# Patient Record
Sex: Female | Born: 2000 | Race: Black or African American | Hispanic: No | Marital: Single | Smoking: Former smoker
Health system: Southern US, Community
[De-identification: ages and names within clinical notes are randomized; demographics above are authoritative.]

## PROBLEM LIST (undated history)

## (undated) ENCOUNTER — Inpatient Hospital Stay (HOSPITAL_COMMUNITY): Payer: Self-pay

## (undated) ENCOUNTER — Emergency Department (HOSPITAL_COMMUNITY): Admission: EM | Payer: Medicaid Other | Source: Home / Self Care

## (undated) DIAGNOSIS — Z3480 Encounter for supervision of other normal pregnancy, unspecified trimester: Secondary | ICD-10-CM

## (undated) HISTORY — PX: NO PAST SURGERIES: SHX2092

## (undated) HISTORY — DX: Encounter for supervision of other normal pregnancy, unspecified trimester: Z34.80

---
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
## 2018-01-01 ENCOUNTER — Ambulatory Visit (HOSPITAL_COMMUNITY)
Admission: RE | Admit: 2018-01-01 | Discharge: 2018-01-01 | Disposition: A | Discharge status: Home / Self Care | Payer: Medicaid Other | Source: Ambulatory Visit | Attending: Certified Nurse Midwife | Admitting: Certified Nurse Midwife

## 2018-01-01 DIAGNOSIS — Z3A00 Weeks of gestation of pregnancy not specified: Secondary | ICD-10-CM | POA: Diagnosis not present

## 2018-01-01 DIAGNOSIS — Z3401 Encounter for supervision of normal first pregnancy, first trimester: Secondary | ICD-10-CM

## 2018-01-01 DIAGNOSIS — O3680X0 Pregnancy with inconclusive fetal viability, not applicable or unspecified: Secondary | ICD-10-CM | POA: Diagnosis not present

## 2018-01-02 ENCOUNTER — Telehealth: Payer: Self-pay

## 2018-01-02 NOTE — Telephone Encounter (Signed)
-----   Message from Sharyon Cable, CNM sent at 01/01/2018  1:47 PM EDT ----- Patient needs appointment in person ASAP on 10/25 to review [place] results from 10/24.   Needs to see a provider in order to review failed pregnancy, obtain CBC and management.   Thank you!

## 2018-01-02 NOTE — Telephone Encounter (Signed)
Pt aware she needs a visit today to discuss recent ultrasound results but she refused d/t her work schedule. Pt is available Monday, 01/05/18 to discuss results.   Pt adamantly requests results via telephone. Informed pt ultrasound shows nonviable fetus and she needs to keep appt on Monday to discuss further with the provider. Pt agrees and has no further questions.

## 2018-01-05 ENCOUNTER — Encounter: Payer: Medicaid Other | Admitting: Advanced Practice Midwife

## 2018-01-05 DIAGNOSIS — O021 Missed abortion: Secondary | ICD-10-CM | POA: Insufficient documentation

## 2018-01-07 ENCOUNTER — Encounter: Payer: Medicaid Other | Admitting: Certified Nurse Midwife

## 2018-01-08 ENCOUNTER — Ambulatory Visit (INDEPENDENT_AMBULATORY_CARE_PROVIDER_SITE_OTHER): Payer: Medicaid Other | Admitting: Certified Nurse Midwife

## 2018-01-08 ENCOUNTER — Encounter: Payer: Self-pay | Admitting: Certified Nurse Midwife

## 2018-01-08 VITALS — BP 109/71 | HR 71 | Wt 126.0 lb

## 2018-01-08 DIAGNOSIS — O034 Incomplete spontaneous abortion without complication: Secondary | ICD-10-CM

## 2018-01-08 NOTE — Patient Instructions (Signed)

## 2018-01-08 NOTE — Progress Notes (Signed)
Pt here to F/U U/S results on 01/01/18. B/P:109/71 P:71

## 2018-01-08 NOTE — Progress Notes (Signed)
Patient ID: Erika Bailey, female   DOB: [birth year], 17 y.o.   MRN: [id number]  HPI 17 y.o. G1P0 @[redacted]w[redacted]d  by LMP presents to office for f/u and [place] results.  She denies abdominal pain or vaginal bleeding. Was seen for initial prenatal visit on 10/10 at [redacted]w[redacted]d and BS [place] saw no embryo.   Her quant hcg on 12/18/17 was 180.399 and ultrasound on 12/18/17 showed intrauterine saclike structure with no yolk sac or embryo. Repeat US on 01/01/18 showed irregular shape of gestational sac and yolk sac, continuation of no embryo. Diagnosed with failed pregnancy on 01/01/18.  OBJECTIVE  BP 109/71   Pulse 71   Wt 126 lb (57.2 kg)   LMP 10/02/2017   VS reviewed, nursing note reviewed,  Constitutional: well developed, well nourished, no distress HEENT: normocephalic CV: normal rate Pulm/chest wall: normal effort Abdomen: soft Neuro: alert and oriented x 3 Skin: warm, dry Psych: affect normal  ASSESSMENT/PLAN 1. Incomplete miscarriage - [place] results reviewed, discussed need of medical management with Cytotec due to failed expectant management. Answered patient questions.  - Reviewed procedure with patient and explained in detail process with follow up in 1 week for repeat lab work and to make sure she is bleeding. Explained possibility of D&E procedure if she does not pass POC. Patient verbalizes understanding  - No recent HGB obtained, will obtain CBC today and prescribe Cytotec tomorrow in addition to pain medication once results of CBC are back.  - CBC - Beta hCG quant (ref lab)  Follow up in 1 week for repeat HCG and 2 weeks for appointment with provider.   Steward Drone, CNM 01/08/18 10:33 AM

## 2018-01-09 LAB — CBC
Hematocrit: 36.8 % (ref 34.0–46.6)
Hemoglobin: 11.7 g/dL (ref 11.1–15.9)
MCH: 28.7 pg (ref 26.6–33.0)
MCHC: 31.8 g/dL (ref 31.5–35.7)
MCV: 90 fL (ref 79–97)
Platelets: 310 10*3/uL (ref 150–450)
RBC: 4.08 x10E6/uL (ref 3.77–5.28)
RDW: 14 % (ref 12.3–15.4)
WBC: 6.8 10*3/uL (ref 3.4–10.8)

## 2018-01-09 LAB — BETA HCG QUANT (REF LAB): hCG Quant: 84948 m[IU]/mL

## 2018-01-09 MED ORDER — MISOPROSTOL 200 MCG PO TABS: ORAL_TABLET | ORAL | 0 refills | Status: DC

## 2018-01-09 NOTE — Addendum Note (Signed)
Addended by: Sharyon Cable on: 01/09/2018 04:31 PM   Modules accepted: Orders

## 2018-01-10 ENCOUNTER — Encounter (HOSPITAL_COMMUNITY): Payer: Self-pay

## 2018-01-10 ENCOUNTER — Inpatient Hospital Stay (HOSPITAL_COMMUNITY)
Admission: AD | Admit: 2018-01-10 | Discharge: 2018-01-10 | Disposition: A | Discharge status: Home / Self Care | Payer: Medicaid Other | Source: Ambulatory Visit | Attending: Obstetrics and Gynecology | Admitting: Obstetrics and Gynecology

## 2018-01-10 DIAGNOSIS — O034 Incomplete spontaneous abortion without complication: Secondary | ICD-10-CM | POA: Diagnosis not present

## 2018-01-10 DIAGNOSIS — Z87891 Personal history of nicotine dependence: Secondary | ICD-10-CM | POA: Insufficient documentation

## 2018-01-10 DIAGNOSIS — R109 Unspecified abdominal pain: Secondary | ICD-10-CM | POA: Insufficient documentation

## 2018-01-10 LAB — URINALYSIS, ROUTINE W REFLEX MICROSCOPIC
Bilirubin Urine: NEGATIVE
Glucose, UA: NEGATIVE mg/dL
Ketones, ur: NEGATIVE mg/dL
Nitrite: NEGATIVE
Protein, ur: NEGATIVE mg/dL
RBC / HPF: >50 RBC/hpf — ABNORMAL HIGH (ref 0–5)
Specific Gravity, Urine: 1.014 (ref 1.005–1.030)
pH: 9 — ABNORMAL HIGH (ref 5.0–8.0)

## 2018-01-10 MED ORDER — ONDANSETRON 4 MG PO TBDP: 4.0000 mg | ORAL_TABLET | Freq: Three times a day (TID) | ORAL | 0 refills | Status: DC | PRN

## 2018-01-10 MED ORDER — ONDANSETRON 8 MG PO TBDP
8.0000 mg | ORAL_TABLET | Freq: Once | ORAL | Status: AC
  Administered 2018-01-10: 8 mg via ORAL
  Filled 2018-01-10: qty 1

## 2018-01-10 MED ORDER — ACETAMINOPHEN-CODEINE #3 300-30 MG PO TABS
1.0000 | ORAL_TABLET | Freq: Once | ORAL | Status: AC
  Administered 2018-01-10: 1 via ORAL
  Filled 2018-01-10: qty 1

## 2018-01-10 MED ORDER — ACETAMINOPHEN-CODEINE #3 300-30 MG PO TABS: 1.0000 | ORAL_TABLET | Freq: Four times a day (QID) | ORAL | 0 refills | Status: AC | PRN

## 2018-01-10 NOTE — MAU Note (Signed)
Erika Bailey is a 17 y.o.here in MAU reporting: +lower abdominal cramping. Constant. Seen in the office on 10/31 for an incomplete miscarriage. Pain score: 6/10 Vitals:   01/10/18 1558  BP: 107/62  Pulse: 77  Resp: 16  Temp: 98.8 F (37.1 C)  SpO2: 99%      Lab orders placed from triage: ua

## 2018-01-10 NOTE — Discharge Instructions (Signed)

## 2018-01-10 NOTE — MAU Provider Note (Signed)
History     CSN: [id number]  Arrival date and time: 01/10/18 1542   First Provider Initiated Contact with Patient 01/10/18 1612      Chief Complaint  Patient presents with  . Abdominal Pain   Erika Bailey is a 17 y.o. G1P0  who presents to MAU with complaints of abdominal pain. She was diagnosed with a incomplete miscarriage and Rx Cytotec yesterday. Took Cytotec this afternoon around 1400 prior to arrival to MAU. She reports abdominal pain started shortly after taking Cytotec. Rates pain 8/10- has taken Ibuprofen with no relief of pain, was not prescribed medication for pain when given Cytotec. She reports pain has decreased since arrival to MAU and when she started having vaginal bleeding. She denies having to change pads or soaking through underwear. She reports nausea is associated with the pain. Has f/u in the office on Thursday for miscarriage.    OB History    Gravida  1   Para      Term      Preterm      AB      Living        SAB      TAB      Ectopic      Multiple      Live Births              Past Medical History:  Diagnosis Date  . Supervision of other normal pregnancy, antepartum 12/18/2017    Nursing Staff Provider Office Location   [facility] Dating   LMP Language   English Anatomy US   Flu Vaccine   12/18/17 Genetic Screen  NIPS:   AFP:   First Screen:  Quad:   TDaP vaccine    Hgb A1C or  GTT Early  Third trimester  Rhogam     LAB RESULTS  Feeding Plan  Breast Blood Type    Contraception  Antibody   Circumcision  Rubella   Pediatrician   RPR    Support Person  HBsAg    Prenatal Classes     History reviewed. No pertinent surgical history.  Family History  Problem Relation Age of Onset  . Healthy Mother   . Healthy Father   . Cancer Maternal Grandmother     Social History   Tobacco Use  . Smoking status: Former Games developer  . Smokeless tobacco: Never Used  Substance Use Topics  . Alcohol use: No  . Drug use: Never    Allergies:   Allergies  Allergen Reactions  . Peanuts [Peanut Oil] Itching and Swelling    Medications Prior to Admission  Medication Sig Dispense Refill Last Dose  . misoprostol (CYTOTEC) 200 MCG tablet Place four tablets in between your gums and cheeks (two tablets on each side), swallow if not dissolved in 20 minutes 4 tablet 0   . prenatal vitamin w/FE, FA (PRENATAL 1 + 1) 27-1 MG TABS tablet Take 1 tablet by mouth daily at 12 noon.   Taking    Review of Systems  Constitutional: Negative.   Respiratory: Negative.   Cardiovascular: Negative.   Gastrointestinal: Positive for abdominal pain and nausea. Negative for constipation, diarrhea and vomiting.  Genitourinary: Positive for vaginal bleeding. Negative for difficulty urinating, dysuria, frequency, pelvic pain and urgency.   Physical Exam   Blood pressure 107/62, pulse 77, temperature 98.8 F (37.1 C), temperature source Oral, resp. rate 16, weight 58.1 kg, last menstrual period 10/02/2017, SpO2 99 %.  Physical Exam  Nursing note and  vitals reviewed. Constitutional: She is oriented to person, place, and time. She appears well-developed and well-nourished. No distress.  Cardiovascular: Normal rate, regular rhythm and normal heart sounds.  Respiratory: Effort normal and breath sounds normal. No respiratory distress. She has no wheezes.  GI: Soft. She exhibits no distension. There is no tenderness. There is no rebound.  Neurological: She is alert and oriented to person, place, and time.  Psychiatric: She has a normal mood and affect. Her behavior is normal. Thought content normal.    MAU Course  Procedures  MDM Orders Placed This Encounter  Procedures  . Urinalysis, Routine w reflex microscopic   Meds ordered this encounter  Medications  . acetaminophen-codeine (TYLENOL #3) 300-30 MG per tablet 1 tablet  . ondansetron (ZOFRAN-ODT) disintegrating tablet 8 mg  . ondansetron (ZOFRAN ODT) 4 MG disintegrating tablet    Sig: Take 1  tablet (4 mg total) by mouth every 8 (eight) hours as needed for nausea or vomiting.    Dispense:  10 tablet    Refill:  0    Order Specific Question:   Supervising Provider    Answer:   CONSTANT, PEGGY [[id number]]  . acetaminophen-codeine (TYLENOL #3) 300-30 MG tablet    Sig: Take 1 tablet by mouth every 6 (six) hours as needed for up to 3 days for moderate pain.    Dispense:  12 tablet    Refill:  0    Order Specific Question:   Supervising Provider    Answer:   Reva Bores [[id number]]   Treatments in MAU included Tylenol #3 tablet and Zofran 8mg  ODT. On reassessment patient reports pain is relieved. Rx for Zofran and Tylenol #3 sent to pharmacy of choice. Pt stable at time of discharge. Continue f/u as scheduled in the office.   Assessment and Plan   1. Incomplete miscarriage   2. Abdominal cramping    Discharge home  Follow up as scheduled for miscarriage  Rx for Tylenol 3 and Zofran sent to pharmacy of choice  Discussed reasons to return to MAU   Follow-up Information    [facility] Follow up.   Specialty:  Obstetrics and Gynecology Why:  Follow up as scheduled on Thursday  Contact information: [address] [place] [address] [phone]         Allergies as of 01/10/2018      Reactions   Peanuts [peanut Oil] Itching, Swelling      Medication List    TAKE these medications   acetaminophen-codeine 300-30 MG tablet Commonly known as:  TYLENOL #3 Take 1 tablet by mouth every 6 (six) hours as needed for up to 3 days for moderate pain.   misoprostol 200 MCG tablet Commonly known as:  CYTOTEC Place four tablets in between your gums and cheeks (two tablets on each side), swallow if not dissolved in 20 minutes   ondansetron 4 MG disintegrating tablet Commonly known as:  ZOFRAN-ODT Take 1 tablet (4 mg total) by mouth every 8 (eight) hours as needed for nausea or vomiting.   prenatal vitamin w/FE, FA 27-1 MG Tabs  tablet Take 1 tablet by mouth daily at 12 noon.      Sharyon Cable CNM 01/10/2018, 4:51 PM

## 2018-01-15 ENCOUNTER — Encounter: Payer: Self-pay | Admitting: Advanced Practice Midwife

## 2018-01-15 ENCOUNTER — Ambulatory Visit (INDEPENDENT_AMBULATORY_CARE_PROVIDER_SITE_OTHER): Payer: Medicaid Other | Admitting: Advanced Practice Midwife

## 2018-01-15 VITALS — BP 97/60 | HR 89 | Wt 129.2 lb

## 2018-01-15 DIAGNOSIS — O034 Incomplete spontaneous abortion without complication: Secondary | ICD-10-CM | POA: Diagnosis not present

## 2018-01-15 DIAGNOSIS — Z3A15 15 weeks gestation of pregnancy: Secondary | ICD-10-CM

## 2018-01-15 NOTE — Patient Instructions (Signed)

## 2018-01-15 NOTE — Progress Notes (Signed)
Subjective: Erika Bailey is a G1P0 at [redacted]w[redacted]d who presents to the [facility] today for MAU follow up.  She does nothave a history of any mental health concerns. She is not currently sexually active. She is currently using no method for birth control. Patient states family as her support system.   BP 97/60   Pulse 89   Wt 129 lb 3.2 oz (58.6 kg)   LMP 10/02/2017   Birth Control History:  No prior history   MDM Patient counseled on all options for birth control today including LARC. Patient desires OCP  initiated for birth control.   Assessment:  17 y.o. female considering pills for birth control  Plan:  Further family planning counseling once requested lab work come back   Gwyndolyn Saxon, [place] 01/15/2018 3:39 PM

## 2018-01-15 NOTE — Progress Notes (Signed)
FOLLOWUP CARE AFTER INCOMPLETE MISCARRIAGE ENCOUNTER NOTE  Subjective:   Erika Bailey is a 17 y.o. G1P0 female here for follow-up after diagnosis of incomplete miscarriage and administration of Cytotec on 01/10/18.   Current complaints: None.   Patient denies abdominal pain and endorses "just a little" brown vaginal spotting. Denies cramping, fever, abdominal tenderness, discharge, pelvic pain, problems with intercourse or other gynecologic concerns.    Obstetric History OB History  Gravida Para Term Preterm AB Living  1            SAB TAB Ectopic Multiple Live Births               # Outcome Date GA Lbr Len/2nd Weight Sex Delivery Anes PTL Lv  1 Current             Past Medical History:  Diagnosis Date  . Supervision of other normal pregnancy, antepartum 12/18/2017    Nursing Staff Provider Office Location   [facility] Dating   LMP Language   English Anatomy US   Flu Vaccine   12/18/17 Genetic Screen  NIPS:   AFP:   First Screen:  Quad:   TDaP vaccine    Hgb A1C or  GTT Early  Third trimester  Rhogam     LAB RESULTS  Feeding Plan  Breast Blood Type    Contraception  Antibody   Circumcision  Rubella   Pediatrician   RPR    Support Person  HBsAg    Prenatal Classes     No past surgical history on file.  Current Outpatient Medications on File Prior to Visit  Medication Sig Dispense Refill  . acetaminophen (TYLENOL) 500 MG tablet Take 500 mg by mouth every 6 (six) hours as needed.    . misoprostol (CYTOTEC) 200 MCG tablet Place four tablets in between your gums and cheeks (two tablets on each side), swallow if not dissolved in 20 minutes (Patient not taking: Reported on 01/15/2018) 4 tablet 0  . ondansetron (ZOFRAN ODT) 4 MG disintegrating tablet Take 1 tablet (4 mg total) by mouth every 8 (eight) hours as needed for nausea or vomiting. (Patient not taking: Reported on 01/15/2018) 10 tablet 0  . prenatal vitamin w/FE, FA (PRENATAL 1 + 1) 27-1 MG TABS tablet Take 1 tablet by mouth daily  at 12 noon.     No current facility-administered medications on file prior to visit.     Allergies  Allergen Reactions  . Peanuts [Peanut Oil] Itching and Swelling    Social History:  reports that she has quit smoking. She has never used smokeless tobacco. She reports that she does not drink alcohol or use drugs.  Family History  Problem Relation Age of Onset  . Healthy Mother   . Healthy Father   . Cancer Maternal Grandmother     The following portions of the patient's history were reviewed and updated as appropriate: allergies, current medications, past family history, past medical history, past social history, past surgical history and problem list.  Review of Systems Pertinent items noted in HPI and remainder of comprehensive ROS otherwise negative.   Objective:  BP 97/60   Pulse 89   Wt 129 lb 3.2 oz (58.6 kg)   LMP 10/02/2017  CONSTITUTIONAL: Well-developed, well-nourished female in no acute distress.  HENT:  Normocephalic, atraumatic, Oropharynx is clear and moist EYES: Conjunctivae and EOM are normal. Pupils are equal, round, and reactive to light. No scleral icterus.  NECK: Normal range of motion, supple,  no masses.  SKIN: Skin is warm and dry. No rash noted. Not diaphoretic. No erythema. No pallor. MUSCULOSKELETAL: Normal range of motion. No tenderness.  No cyanosis, clubbing, or edema.  NEUROLOGIC: Alert and oriented to person, place, and time. Normal reflexes, muscle tone coordination. No cranial nerve deficit noted. PSYCHIATRIC: Normal mood and affect. Normal behavior. Normal judgment and thought content. RESPIRATORY: Effort and breath sounds normal, no problems with respiration noted. ABDOMEN: Soft, no distention noted.  No tenderness, rebound or guarding.    Assessment and Plan:  1. Incomplete miscarriage - Patient met with Lynnea Ferrier, LCSW as part of my appt today - Will plan to start OCPs once result of Quant hCG is obtained - Patient confirms she is  comfortable communicating via MyChart as needed - Beta hCG quant (ref lab) - Discussed that patient may ovulate in as little as 4 weeks after miscarriage. This can be triggering and she should reach out to her support network including the [facility] team as needed  Routine preventative health maintenance measures emphasized. Please refer to After Visit Summary for other counseling recommendations.    Maryelizabeth Kaufmann, [place] [facility] [employer]

## 2018-01-15 NOTE — Progress Notes (Signed)
Pt is here for SAB follow up. Pt states she is still spotting (brownish). Repeat HCG today

## 2018-01-16 LAB — BETA HCG QUANT (REF LAB): hCG Quant: 43964 m[IU]/mL

## 2018-01-20 ENCOUNTER — Inpatient Hospital Stay (HOSPITAL_COMMUNITY)
Admission: AD | Admit: 2018-01-20 | Discharge: 2018-01-21 | DRG: 770 | Disposition: A | Discharge status: Home / Self Care | Payer: Medicaid Other | Attending: Family Medicine | Admitting: Family Medicine

## 2018-01-20 ENCOUNTER — Encounter (HOSPITAL_COMMUNITY): Payer: Self-pay | Admitting: *Deleted

## 2018-01-20 ENCOUNTER — Telehealth: Payer: Self-pay | Admitting: Licensed Clinical Social Worker

## 2018-01-20 DIAGNOSIS — Z87891 Personal history of nicotine dependence: Secondary | ICD-10-CM

## 2018-01-20 DIAGNOSIS — O034 Incomplete spontaneous abortion without complication: Principal | ICD-10-CM | POA: Diagnosis present

## 2018-01-20 DIAGNOSIS — O209 Hemorrhage in early pregnancy, unspecified: Secondary | ICD-10-CM

## 2018-01-20 LAB — URINALYSIS, ROUTINE W REFLEX MICROSCOPIC
BACTERIA UA: NONE SEEN
Bilirubin Urine: NEGATIVE
GLUCOSE, UA: NEGATIVE mg/dL
KETONES UR: 80 mg/dL — AB
Nitrite: NEGATIVE
PROTEIN: 30 mg/dL — AB
Specific Gravity, Urine: 1.015 (ref 1.005–1.030)
pH: 6 (ref 5.0–8.0)

## 2018-01-20 LAB — POCT PREGNANCY, URINE: PREG TEST UR: POSITIVE — AB

## 2018-01-20 NOTE — MAU Note (Signed)
PT SAYS SHE IS ON CYCLE NOW.  NO BIRTH CONTROL.    SAYS CRAMPS  ARE WORSE  THIS  MTH-  TOOK 1 TYLENOL WITH CODEINE - RECEIVED FROM HERE  WHEN HAD SAB.  -   NO RELIEF.   SAYS HAS HAD  H/A  - STARTED ON Sunday -  NO OTHER PAIN-- NO RELIEF.      LAST H/A-  LAST WEEK-  TOOK TYLENOL WITH CODEINE- NO RELIEF.   HAS HX- OF  H/A- DOES NOT TAKE ANY MED.

## 2018-01-20 NOTE — Telephone Encounter (Signed)
Left message regarding scheduled appointment

## 2018-01-21 ENCOUNTER — Other Ambulatory Visit: Payer: Self-pay

## 2018-01-21 ENCOUNTER — Inpatient Hospital Stay (HOSPITAL_COMMUNITY): Payer: Medicaid Other | Admitting: Anesthesiology

## 2018-01-21 ENCOUNTER — Encounter (HOSPITAL_COMMUNITY): Payer: Self-pay | Admitting: Advanced Practice Midwife

## 2018-01-21 ENCOUNTER — Inpatient Hospital Stay (HOSPITAL_COMMUNITY): Payer: Medicaid Other

## 2018-01-21 ENCOUNTER — Encounter (HOSPITAL_COMMUNITY)
Admission: AD | Disposition: A | Discharge status: Home / Self Care | Payer: Self-pay | Source: Home / Self Care | Attending: Family Medicine

## 2018-01-21 DIAGNOSIS — Z3A00 Weeks of gestation of pregnancy not specified: Secondary | ICD-10-CM | POA: Diagnosis not present

## 2018-01-21 DIAGNOSIS — Z87891 Personal history of nicotine dependence: Secondary | ICD-10-CM | POA: Diagnosis not present

## 2018-01-21 DIAGNOSIS — O021 Missed abortion: Secondary | ICD-10-CM | POA: Diagnosis not present

## 2018-01-21 DIAGNOSIS — O034 Incomplete spontaneous abortion without complication: Secondary | ICD-10-CM | POA: Diagnosis present

## 2018-01-21 DIAGNOSIS — O209 Hemorrhage in early pregnancy, unspecified: Secondary | ICD-10-CM | POA: Diagnosis not present

## 2018-01-21 HISTORY — PX: DILATION AND EVACUATION: SHX1459

## 2018-01-21 LAB — HCG, QUANTITATIVE, PREGNANCY: hCG, Beta Chain, Quant, S: 16329 m[IU]/mL — ABNORMAL HIGH (ref <5)

## 2018-01-21 LAB — CBC WITH DIFFERENTIAL/PLATELET
BASOS ABS: 0 10*3/uL (ref 0.0–0.1)
BASOS PCT: 0 %
EOS PCT: 0 %
Eosinophils Absolute: 0 10*3/uL (ref 0.0–0.5)
HEMATOCRIT: 35.8 % — AB (ref 36.0–46.0)
Hemoglobin: 12 g/dL (ref 12.0–15.0)
LYMPHS PCT: 6 %
Lymphs Abs: 0.8 10*3/uL (ref 0.7–4.0)
MCH: 29.2 pg (ref 26.0–34.0)
MCHC: 33.5 g/dL (ref 30.0–36.0)
MCV: 87.1 fL (ref 80.0–100.0)
Monocytes Absolute: 0.5 10*3/uL (ref 0.1–1.0)
Monocytes Relative: 4 %
NEUTROS ABS: 12.5 10*3/uL — AB (ref 1.7–7.7)
NRBC: 0 % (ref 0.0–0.2)
Neutrophils Relative %: 90 %
PLATELETS: 257 10*3/uL (ref 150–400)
RBC: 4.11 MIL/uL (ref 3.87–5.11)
RDW: 13.9 % (ref 11.5–15.5)
WBC: 13.8 10*3/uL — AB (ref 4.0–10.5)

## 2018-01-21 LAB — TYPE AND SCREEN
ABO/RH(D): A POS
Antibody Screen: NEGATIVE

## 2018-01-21 LAB — INFLUENZA PANEL BY PCR (TYPE A & B)
INFLBPCR: NEGATIVE
Influenza A By PCR: NEGATIVE

## 2018-01-21 LAB — ABO/RH: ABO/RH(D): A POS

## 2018-01-21 LAB — LACTIC ACID, PLASMA: LACTIC ACID, VENOUS: 1 mmol/L (ref 0.5–1.9)

## 2018-01-21 SURGERY — DILATION AND EVACUATION, UTERUS
Anesthesia: General

## 2018-01-21 MED ORDER — KETOROLAC TROMETHAMINE 30 MG/ML IJ SOLN
INTRAMUSCULAR | Status: DC | PRN
  Administered 2018-01-21: 30 mg via INTRAVENOUS

## 2018-01-21 MED ORDER — PRENATAL MULTIVITAMIN CH
1.0000 | ORAL_TABLET | Freq: Every day | ORAL | Status: DC
  Filled 2018-01-21: qty 1

## 2018-01-21 MED ORDER — ONDANSETRON HCL 4 MG/2ML IJ SOLN: 4.0000 mg | Freq: Four times a day (QID) | INTRAMUSCULAR | Status: DC | PRN

## 2018-01-21 MED ORDER — FENTANYL CITRATE (PF) 100 MCG/2ML IJ SOLN
INTRAMUSCULAR | Status: DC | PRN
  Administered 2018-01-21: 100 ug via INTRAVENOUS

## 2018-01-21 MED ORDER — LACTATED RINGERS IV SOLN: INTRAVENOUS | Status: DC

## 2018-01-21 MED ORDER — DOXYCYCLINE HYCLATE 100 MG IV SOLR
200.0000 mg | INTRAVENOUS | Status: AC
  Administered 2018-01-21: 200 mg via INTRAVENOUS
  Filled 2018-01-21: qty 200

## 2018-01-21 MED ORDER — MIDAZOLAM HCL 2 MG/2ML IJ SOLN
INTRAMUSCULAR | Status: AC
  Filled 2018-01-21: qty 2

## 2018-01-21 MED ORDER — IBUPROFEN 600 MG PO TABS
600.0000 mg | ORAL_TABLET | Freq: Four times a day (QID) | ORAL | Status: DC | PRN
  Administered 2018-01-21: 600 mg via ORAL
  Filled 2018-01-21: qty 1

## 2018-01-21 MED ORDER — PROPOFOL 10 MG/ML IV BOLUS
INTRAVENOUS | Status: DC | PRN
  Administered 2018-01-21: 50 mg via INTRAVENOUS
  Administered 2018-01-21: 100 mg via INTRAVENOUS

## 2018-01-21 MED ORDER — PROPOFOL 10 MG/ML IV BOLUS
INTRAVENOUS | Status: AC
  Filled 2018-01-21: qty 20

## 2018-01-21 MED ORDER — LIDOCAINE HCL (CARDIAC) PF 100 MG/5ML IV SOSY
PREFILLED_SYRINGE | INTRAVENOUS | Status: DC | PRN
  Administered 2018-01-21: 70 mg via INTRAVENOUS

## 2018-01-21 MED ORDER — ONDANSETRON HCL 4 MG/2ML IJ SOLN
INTRAMUSCULAR | Status: DC | PRN
  Administered 2018-01-21: 4 mg via INTRAVENOUS

## 2018-01-21 MED ORDER — BUPIVACAINE-EPINEPHRINE 0.5% -1:200000 IJ SOLN
INTRAMUSCULAR | Status: DC | PRN
  Administered 2018-01-21: 10 mL

## 2018-01-21 MED ORDER — HYDROMORPHONE HCL 1 MG/ML IJ SOLN
0.5000 mg | Freq: Once | INTRAMUSCULAR | Status: AC
  Administered 2018-01-21: 0.5 mg via INTRAMUSCULAR
  Filled 2018-01-21: qty 1

## 2018-01-21 MED ORDER — SODIUM CHLORIDE 0.9 % IV SOLN
2.0000 g | Freq: Once | INTRAVENOUS | Status: AC
  Administered 2018-01-21: 2 g via INTRAVENOUS
  Filled 2018-01-21: qty 2

## 2018-01-21 MED ORDER — ONDANSETRON HCL 4 MG PO TABS: 4.0000 mg | ORAL_TABLET | Freq: Four times a day (QID) | ORAL | Status: DC | PRN

## 2018-01-21 MED ORDER — METHYLERGONOVINE MALEATE 0.2 MG/ML IJ SOLN
INTRAMUSCULAR | Status: AC
  Filled 2018-01-21: qty 1

## 2018-01-21 MED ORDER — SUGAMMADEX SODIUM 500 MG/5ML IV SOLN
INTRAVENOUS | Status: AC
  Filled 2018-01-21: qty 5

## 2018-01-21 MED ORDER — ONDANSETRON HCL 4 MG/2ML IJ SOLN
INTRAMUSCULAR | Status: AC
  Filled 2018-01-21: qty 2

## 2018-01-21 MED ORDER — BUPIVACAINE-EPINEPHRINE (PF) 0.5% -1:200000 IJ SOLN
INTRAMUSCULAR | Status: AC
  Filled 2018-01-21: qty 30

## 2018-01-21 MED ORDER — DEXAMETHASONE SODIUM PHOSPHATE 10 MG/ML IJ SOLN
INTRAMUSCULAR | Status: AC
  Filled 2018-01-21: qty 1

## 2018-01-21 MED ORDER — ROCURONIUM BROMIDE 100 MG/10ML IV SOLN
INTRAVENOUS | Status: AC
  Filled 2018-01-21: qty 1

## 2018-01-21 MED ORDER — LACTATED RINGERS IV SOLN
INTRAVENOUS | Status: DC
  Administered 2018-01-21 (×2): via INTRAVENOUS

## 2018-01-21 MED ORDER — HYDROMORPHONE HCL 1 MG/ML IJ SOLN: 0.2000 mg | INTRAMUSCULAR | Status: DC | PRN

## 2018-01-21 MED ORDER — METHYLERGONOVINE MALEATE 0.2 MG/ML IJ SOLN
INTRAMUSCULAR | Status: DC | PRN
  Administered 2018-01-21: 0.2 mg via INTRAMUSCULAR

## 2018-01-21 MED ORDER — 0.9 % SODIUM CHLORIDE (POUR BTL) OPTIME
TOPICAL | Status: DC | PRN
  Administered 2018-01-21: 1000 mL

## 2018-01-21 MED ORDER — SUGAMMADEX SODIUM 500 MG/5ML IV SOLN
INTRAVENOUS | Status: DC | PRN
  Administered 2018-01-21: 400 mg via INTRAVENOUS

## 2018-01-21 MED ORDER — DEXAMETHASONE SODIUM PHOSPHATE 10 MG/ML IJ SOLN
INTRAMUSCULAR | Status: DC | PRN
  Administered 2018-01-21: 10 mg via INTRAVENOUS

## 2018-01-21 MED ORDER — ROCURONIUM BROMIDE 100 MG/10ML IV SOLN
INTRAVENOUS | Status: DC | PRN
  Administered 2018-01-21: 50 mg via INTRAVENOUS

## 2018-01-21 MED ORDER — FENTANYL CITRATE (PF) 100 MCG/2ML IJ SOLN: 25.0000 ug | INTRAMUSCULAR | Status: DC | PRN

## 2018-01-21 MED ORDER — OXYTOCIN 10 UNIT/ML IJ SOLN
INTRAMUSCULAR | Status: AC
  Filled 2018-01-21: qty 2

## 2018-01-21 MED ORDER — SODIUM CHLORIDE 0.9 % IV SOLN
2.0000 g | Freq: Two times a day (BID) | INTRAVENOUS | Status: DC
  Administered 2018-01-21: 2 g via INTRAVENOUS
  Filled 2018-01-21 (×2): qty 2

## 2018-01-21 MED ORDER — LIDOCAINE HCL (CARDIAC) PF 100 MG/5ML IV SOSY
PREFILLED_SYRINGE | INTRAVENOUS | Status: AC
  Filled 2018-01-21: qty 5

## 2018-01-21 MED ORDER — FENTANYL CITRATE (PF) 250 MCG/5ML IJ SOLN
INTRAMUSCULAR | Status: AC
  Filled 2018-01-21: qty 5

## 2018-01-21 MED ORDER — MIDAZOLAM HCL 2 MG/2ML IJ SOLN
INTRAMUSCULAR | Status: DC | PRN
  Administered 2018-01-21: 1 mg via INTRAVENOUS

## 2018-01-21 SURGICAL SUPPLY — 18 items
CATH ROBINSON RED A/P 16FR (CATHETERS) ×3 IMPLANT
DECANTER SPIKE VIAL GLASS SM (MISCELLANEOUS) IMPLANT
GLOVE BIO SURGEON STRL SZ 6.5 (GLOVE) ×2 IMPLANT
GLOVE BIO SURGEONS STRL SZ 6.5 (GLOVE) ×1
GLOVE BIOGEL PI IND STRL 7.0 (GLOVE) ×2 IMPLANT
GLOVE BIOGEL PI INDICATOR 7.0 (GLOVE) ×4
GOWN STRL REUS W/TWL LRG LVL3 (GOWN DISPOSABLE) ×6 IMPLANT
KIT BERKELEY 1ST TRIMESTER 3/8 (MISCELLANEOUS) IMPLANT
NS IRRIG 1000ML POUR BTL (IV SOLUTION) ×3 IMPLANT
PACK VAGINAL MINOR WOMEN LF (CUSTOM PROCEDURE TRAY) ×3 IMPLANT
PAD OB MATERNITY 4.3X12.25 (PERSONAL CARE ITEMS) ×3 IMPLANT
PAD PREP 24X48 CUFFED NSTRL (MISCELLANEOUS) ×3 IMPLANT
SET BERKELEY SUCTION TUBING (SUCTIONS) IMPLANT
TOWEL OR 17X24 6PK STRL BLUE (TOWEL DISPOSABLE) ×6 IMPLANT
VACURETTE 10 RIGID CVD (CANNULA) ×2 IMPLANT
VACURETTE 7MM CVD STRL WRAP (CANNULA) IMPLANT
VACURETTE 8 RIGID CVD (CANNULA) IMPLANT
VACURETTE 9 RIGID CVD (CANNULA) IMPLANT

## 2018-01-21 NOTE — H&P (Signed)
Erika Bailey is an 17 y.o. female  G1P0010 who presents with vaginal bleeding and cramping.  Was seen here on 01/08/18 for an incomplete miscarriage and treated with Cytotec HCG levels were: 10/10 = 180,399 10/31 = 84,948 11/7  = 43,964  RN  Note: PT SAYS SHE IS ON CYCLE NOW.  NO BIRTH CONTROL.    SAYS CRAMPS  ARE WORSE  THIS  MTH-  TOOK 1 TYLENOL WITH CODEINE - RECEIVED FROM HERE  WHEN HAD SAB.  -   NO RELIEF.   SAYS HAS HAD  H/A  - STARTED ON Sunday -  NO OTHER PAIN-- NO RELIEF.      LAST H/A-  LAST WEEK-  TOOK TYLENOL WITH CODEINE- NO RELIEF.   HAS HX- OF  H/A- DOES NOT TAKE ANY MED.      Me Patient's last menstrual period was 01/18/2018.    Past Medical History:  Diagnosis Date  . Supervision of other normal pregnancy, antepartum 12/18/2017    Nursing Staff Provider Office Location   [facility] Dating   LMP Language   English Anatomy US   Flu Vaccine   12/18/17 Genetic Screen  NIPS:   AFP:   First Screen:  Quad:   TDaP vaccine    Hgb A1C or  GTT Early  Third trimester  Rhogam     LAB RESULTS  Feeding Plan  Breast Blood Type    Contraception  Antibody   Circumcision  Rubella   Pediatrician   RPR    Support Person  HBsAg    Prenatal Classes     History reviewed. No pertinent surgical history.  Family History  Problem Relation Age of Onset  . Healthy Mother   . Healthy Father   . Cancer Maternal Grandmother     Social History:  reports that she has quit smoking. She has never used smokeless tobacco. She reports that she does not drink alcohol or use drugs.  Allergies:  Allergies  Allergen Reactions  . Peanuts [Peanut Oil] Itching and Swelling    Medications Prior to Admission  Medication Sig Dispense Refill Last Dose  . acetaminophen (TYLENOL) 500 MG tablet Take 500 mg by mouth every 6 (six) hours as needed.   01/20/2018 at Unknown time  . misoprostol (CYTOTEC) 200 MCG tablet Place four tablets in between your gums and cheeks (two tablets on each side), swallow if not  dissolved in 20 minutes (Patient not taking: Reported on 01/15/2018) 4 tablet 0 Not Taking  . ondansetron (ZOFRAN ODT) 4 MG disintegrating tablet Take 1 tablet (4 mg total) by mouth every 8 (eight) hours as needed for nausea or vomiting. (Patient not taking: Reported on 01/15/2018) 10 tablet 0 Not Taking  . prenatal vitamin w/FE, FA (PRENATAL 1 + 1) 27-1 MG TABS tablet Take 1 tablet by mouth daily at 12 noon.   Not Taking    ROS  Blood pressure 102/69, pulse (!) 124, temperature 99 F (37.2 C), resp. rate 20, height 5\' 6"  (1.676 m), weight 56.6 kg, last menstrual period 01/18/2018, unknown if currently breastfeeding. Physical Exam  Constitutional: She appears well-developed. No distress.  Cardiovascular: Normal rate.  Respiratory: Effort normal.  GI: Soft.  Skin: Skin is warm and dry.  Psychiatric: She has a normal mood and affect. Her behavior is normal.     Results for orders placed or performed during the hospital encounter of 01/20/18 (from the past 24 hour(s))  Pregnancy, urine POC     Status: Abnormal  Collection Time: 01/20/18 11:00 PM  Result Value Ref Range   Preg Test, Ur POSITIVE (A) NEGATIVE  Urinalysis, Routine w reflex microscopic     Status: Abnormal   Collection Time: 01/20/18 11:05 PM  Result Value Ref Range   Color, Urine YELLOW YELLOW   APPearance HAZY (A) CLEAR   Specific Gravity, Urine 1.015 1.005 - 1.030   pH 6.0 5.0 - 8.0   Glucose, UA NEGATIVE NEGATIVE mg/dL   Hgb urine dipstick LARGE (A) NEGATIVE   Bilirubin Urine NEGATIVE NEGATIVE   Ketones, ur 80 (A) NEGATIVE mg/dL   Protein, ur 30 (A) NEGATIVE mg/dL   Nitrite NEGATIVE NEGATIVE   Leukocytes, UA SMALL (A) NEGATIVE   RBC / HPF >50 (H) 0 - 5 RBC/hpf   WBC, UA >50 (H) 0 - 5 WBC/hpf   Bacteria, UA NONE SEEN NONE SEEN   Squamous Epithelial / LPF 0-5 0 - 5   Mucus PRESENT   hCG, quantitative, pregnancy     Status: Abnormal   Collection Time: 01/21/18 12:14 AM  Result Value Ref Range   hCG, Beta Chain,  Quant, S 16,329 (H) <5 mIU/mL  CBC with Differential/Platelet     Status: Abnormal   Collection Time: 01/21/18 12:14 AM  Result Value Ref Range   WBC 13.8 (H) 4.0 - 10.5 K/uL   RBC 4.11 3.87 - 5.11 MIL/uL   Hemoglobin 12.0 12.0 - 15.0 g/dL   HCT 96.035.8 (L) 45.436.0 - 09.846.0 %   MCV 87.1 80.0 - 100.0 fL   MCH 29.2 26.0 - 34.0 pg   MCHC 33.5 30.0 - 36.0 g/dL   RDW 11.913.9 14.711.5 - 82.915.5 %   Platelets 257 150 - 400 K/uL   nRBC 0.0 0.0 - 0.2 %   Neutrophils Relative % 90 %   Neutro Abs 12.5 (H) 1.7 - 7.7 K/uL   Lymphocytes Relative 6 %   Lymphs Abs 0.8 0.7 - 4.0 K/uL   Monocytes Relative 4 %   Monocytes Absolute 0.5 0.1 - 1.0 K/uL   Eosinophils Relative 0 %   Eosinophils Absolute 0.0 0.0 - 0.5 K/uL   Basophils Relative 0 %   Basophils Absolute 0.0 0.0 - 0.1 K/uL  Influenza panel by PCR (type A & B)     Status: None   Collection Time: 01/21/18 12:41 AM  Result Value Ref Range   Influenza A By PCR NEGATIVE NEGATIVE   Influenza B By PCR NEGATIVE NEGATIVE    [place] Ob Transvaginal  Result Date: 01/21/2018 CLINICAL DATA:  Acute onset of vaginal bleeding. EXAM: TRANSVAGINAL OB ULTRASOUND TECHNIQUE: Transvaginal ultrasound was performed for complete evaluation of the gestation as well as the maternal uterus, adnexal regions, and pelvic cul-de-sac. COMPARISON:  Pelvic ultrasound performed 01/01/2018 FINDINGS: Intrauterine gestational sac: None seen. Yolk sac:  N/A Embryo:  N/A Subchorionic hemorrhage:  None visualized. Maternal uterus/adnexae: The endometrial echo complex is somewhat thickened and heterogeneous in appearance, with cystic areas. This is of uncertain significance. There is no definite evidence of retained products of conception. The ovaries are within normal limits. There is no evidence of ovarian torsion. No suspicious adnexal masses are seen. Trace free fluid is seen within the pelvic cul-de-sac. IMPRESSION: No intrauterine gestational sac seen. Thickened endometrium with cystic areas, of  uncertain significance. No definite evidence for retained products of conception. Given the patient's falling quantitative beta HCG, this reflects recent spontaneous abortion. If the quantitative beta HCG level rises, further evaluation would be helpful to exclude a molar pregnancy.  Electronically Signed   By: Roanna Raider M.D.   On: 01/21/2018 01:17    Assessment/Plan: Possible incomplete SAb with endometritis. IV antibiotics ordered. I posted her for suction D&C,   The risks of surgery were discussed in detail with the patient including but not limited to: bleeding which may require transfusion or reoperation; infection which may require prolonged hospitalization or re-hospitalization and antibiotic therapy; injury to bowel, bladder, ureters and major vessels or other surrounding organs; need for additional procedures including laparotomy; thromboembolic phenomenon, incisional problems and other postoperative or anesthesia complications.  Patient was told that the likelihood that her condition and symptoms will be treated effectively with this surgical management was very high; the postoperative expectations were also discussed in detail. The patient also understands the alternative treatment options which were discussed in full. All questions were answered.  Wynelle Bourgeois 01/21/2018, 3:03 AM   Adam Phenix, MD 01/21/2018 9:05 AM

## 2018-01-21 NOTE — Anesthesia Procedure Notes (Signed)
Procedure Name: Intubation Date/Time: 01/21/2018 9:36 AM Performed by: Asher Muir, CRNA Pre-anesthesia Checklist: Patient identified, Emergency Drugs available, Suction available and Patient being monitored Patient Re-evaluated:Patient Re-evaluated prior to induction Oxygen Delivery Method: Circle system utilized and Simple face mask Preoxygenation: Pre-oxygenation with 100% oxygen Induction Type: IV induction Ventilation: Mask ventilation without difficulty Laryngoscope Size: Mac and 3 Grade View: Grade II Tube type: Oral Tube size: 7.0 mm Number of attempts: 1 Airway Equipment and Method: Stylet Placement Confirmation: ETT inserted through vocal cords under direct vision,  positive ETCO2 and breath sounds checked- equal and bilateral Secured at: 20 (right lip) cm Tube secured with: Tape Dental Injury: Teeth and Oropharynx as per pre-operative assessment

## 2018-01-21 NOTE — Anesthesia Preprocedure Evaluation (Addendum)
Anesthesia Evaluation  Patient identified by MRN, date of birth, ID band Patient awake    Reviewed: Allergy & Precautions, NPO status , Patient's Chart, lab work & pertinent test results  Airway Mallampati: II  TM Distance: >3 FB Neck ROM: Full    Dental no notable dental hx. (+) Chipped, Dental Advisory Given,    Pulmonary neg pulmonary ROS, former smoker,    Pulmonary exam normal breath sounds clear to auscultation       Cardiovascular negative cardio ROS Normal cardiovascular exam Rhythm:Regular Rate:Normal     Neuro/Psych negative neurological ROS  negative psych ROS   GI/Hepatic negative GI ROS, Neg liver ROS,   Endo/Other  negative endocrine ROS  Renal/GU negative Renal ROS  negative genitourinary   Musculoskeletal negative musculoskeletal ROS (+)   Abdominal   Peds  Hematology negative hematology ROS (+)   Anesthesia Other Findings Retained products after missed abortion  Reproductive/Obstetrics                            Anesthesia Physical Anesthesia Plan  ASA: II  Anesthesia Plan: General   Post-op Pain Management:    Induction: Intravenous  PONV Risk Score and Plan: 3 and Midazolam, Dexamethasone and Ondansetron  Airway Management Planned: Oral ETT  Additional Equipment:   Intra-op Plan:   Post-operative Plan: Extubation in OR  Informed Consent: I have reviewed the patients History and Physical, chart, labs and discussed the procedure including the risks, benefits and alternatives for the proposed anesthesia with the patient or authorized representative who has indicated his/her understanding and acceptance.   Dental advisory given  Plan Discussed with: CRNA  Anesthesia Plan Comments:         Anesthesia Quick Evaluation

## 2018-01-21 NOTE — Transfer of Care (Signed)
Immediate Anesthesia Transfer of Care Note  Patient: Erika Bailey  Procedure(s) Performed: DILATATION AND EVACUATION (N/A )  Patient Location: PACU  Anesthesia Type:General  Level of Consciousness: sedated  Airway & Oxygen Therapy: Patient Spontanous Breathing and Patient connected to nasal cannula oxygen  Post-op Assessment: Report given to RN  Post vital signs: Reviewed and stable  Last Vitals:  Vitals Value Taken Time  BP    Temp    Pulse 94 01/21/2018 11:28 AM  Resp    SpO2 100 % 01/21/2018 11:28 AM  Vitals shown include unvalidated device data.  Last Pain:  Vitals:   01/21/18 1015  TempSrc: Oral  PainSc:       Patients Stated Pain Goal: 3 (01/21/18 0800)  Complications: No apparent anesthesia complications

## 2018-01-21 NOTE — Discharge Summary (Signed)
Physician Discharge Summary  Patient ID: Erika Bailey MRN: [id number] DOB/AGE: 19/09/2000 19 y.o.  Admit date: 01/20/2018 Discharge date: 01/21/2018  Admission Diagnoses:Incomplete abortion   Discharge Diagnoses:  Active Problems:   Abnormal uterine bleeding, postpartum   Incomplete abortion   Discharged Condition: good  Hospital Course:  Erika DoomsCameron D Bailey is an 17 y.o. female  G1P0010 who presents with vaginal bleeding and cramping.  Was seen here on 01/08/18 for an incomplete miscarriage and treated with Cytotec HCG levels were: 10/10 = 180,399 10/31 = 84,948 11/7  = 43,964  RN  Note: PT SAYS SHE IS ON CYCLE NOW. NO BIRTH CONTROL. SAYS CRAMPS ARE WORSE THIS MTH- TOOK 1 TYLENOL WITH CODEINE - RECEIVED FROM HERE WHEN HAD SAB. - NO RELIEF. SAYS HAS HAD H/A - STARTED ON "Sunday - NO OTHER PAIN-- NO RELIEF. LAST H/A- LAST WEEK- TOOK TYLENOL WITH CODEINE- NO RELIEF. HAS HX- OF H/A- DOES NOT TAKE ANY MED.      Me Patient's last menstrual period was 01/18/2018.      Past Medical History:  Diagnosis Date  . Supervision of other normal pregnancy, antepartum 12/18/2017    Nursing Staff     Provider Office Location   [facility]            Dating              LMP Language               English Anatomy US        Flu Vaccine        10" /10/19           Genetic Screen              NIPS:   AFP:   First Screen:  Quad:   TDaP vaccine                    Hgb A1C or  GTT     Early  Third trimester  Rhogam                                      LAB RESULTS  Feeding Plan             Breast  Blood Type       Contraception              Antibody               Circumcision                 Rubella               Pediatrician                   RPR                Support Person                        HBsAg                Prenatal Classes                 History reviewed. No pertinent surgical history.       Family History  Problem Relation Age of Onset  . Healthy Mother    . Healthy Father   . Cancer Maternal Grandmother  Social History:  reports that she has quit smoking. She has never used smokeless tobacco. She reports that she does not drink alcohol or use drugs.  Allergies:      Allergies  Allergen Reactions  . Peanuts [Peanut Oil] Itching and Swelling    Medications Prior to Admission  Medication Sig Dispense Refill Last Dose  . acetaminophen (TYLENOL) 500 MG tablet Take 500 mg by mouth every 6 (six) hours as needed.   01/20/2018 at Unknown time  . misoprostol (CYTOTEC) 200 MCG tablet Place four tablets in between your gums and cheeks (two tablets on each side), swallow if not dissolved in 20 minutes (Patient not taking: Reported on 01/15/2018) 4 tablet 0 Not Taking  . ondansetron (ZOFRAN ODT) 4 MG disintegrating tablet Take 1 tablet (4 mg total) by mouth every 8 (eight) hours as needed for nausea or vomiting. (Patient not taking: Reported on 01/15/2018) 10 tablet 0 Not Taking  . prenatal vitamin w/FE, FA (PRENATAL 1 + 1) 27-1 MG TABS tablet Take 1 tablet by mouth daily at 12 noon.   Not Taking    ROS  Blood pressure 102/69, pulse (!) 124, temperature 99 F (37.2 C), resp. rate 20, height 5\' 6"  (1.676 m), weight 56.6 kg, last menstrual period 01/18/2018, unknown if currently breastfeeding. Physical Exam  Constitutional: She appears well-developed. No distress.  Cardiovascular: Normal rate.  Respiratory: Effort normal.  GI: Soft.  Skin: Skin is warm and dry.  Psychiatric: She has a normal mood and affect. Her behavior is normal.     LabResultsLast24Hours       Results for orders placed or performed during the hospital encounter of 01/20/18 (from the past 24 hour(s))  Pregnancy, urine POC     Status: Abnormal   Collection Time: 01/20/18 11:00 PM  Result Value Ref Range   Preg Test, Ur POSITIVE (A) NEGATIVE  Urinalysis, Routine w reflex microscopic     Status: Abnormal   Collection Time: 01/20/18 11:05 PM   Result Value Ref Range   Color, Urine YELLOW YELLOW   APPearance HAZY (A) CLEAR   Specific Gravity, Urine 1.015 1.005 - 1.030   pH 6.0 5.0 - 8.0   Glucose, UA NEGATIVE NEGATIVE mg/dL   Hgb urine dipstick LARGE (A) NEGATIVE   Bilirubin Urine NEGATIVE NEGATIVE   Ketones, ur 80 (A) NEGATIVE mg/dL   Protein, ur 30 (A) NEGATIVE mg/dL   Nitrite NEGATIVE NEGATIVE   Leukocytes, UA SMALL (A) NEGATIVE   RBC / HPF >50 (H) 0 - 5 RBC/hpf   WBC, UA >50 (H) 0 - 5 WBC/hpf   Bacteria, UA NONE SEEN NONE SEEN   Squamous Epithelial / LPF 0-5 0 - 5   Mucus PRESENT   hCG, quantitative, pregnancy     Status: Abnormal   Collection Time: 01/21/18 12:14 AM  Result Value Ref Range   hCG, Beta Chain, Quant, S 16,329 (H) <5 mIU/mL  CBC with Differential/Platelet     Status: Abnormal   Collection Time: 01/21/18 12:14 AM  Result Value Ref Range   WBC 13.8 (H) 4.0 - 10.5 K/uL   RBC 4.11 3.87 - 5.11 MIL/uL   Hemoglobin 12.0 12.0 - 15.0 g/dL   HCT 40.9 (L) 81.1 - 91.4 %   MCV 87.1 80.0 - 100.0 fL   MCH 29.2 26.0 - 34.0 pg   MCHC 33.5 30.0 - 36.0 g/dL   RDW 78.2 95.6 - 21.3 %   Platelets 257 150 - 400 K/uL   nRBC 0.0 0.0 -  0.2 %   Neutrophils Relative % 90 %   Neutro Abs 12.5 (H) 1.7 - 7.7 K/uL   Lymphocytes Relative 6 %   Lymphs Abs 0.8 0.7 - 4.0 K/uL   Monocytes Relative 4 %   Monocytes Absolute 0.5 0.1 - 1.0 K/uL   Eosinophils Relative 0 %   Eosinophils Absolute 0.0 0.0 - 0.5 K/uL   Basophils Relative 0 %   Basophils Absolute 0.0 0.0 - 0.1 K/uL  Influenza panel by PCR (type A & B)     Status: None   Collection Time: 01/21/18 12:41 AM  Result Value Ref Range   Influenza A By PCR NEGATIVE NEGATIVE   Influenza B By PCR NEGATIVE NEGATIVE       ImagingResults(Last48hours)  US Ob Transvaginal  Result Date: 01/21/2018 CLINICAL DATA:  Acute onset of vaginal bleeding. EXAM: TRANSVAGINAL OB ULTRASOUND TECHNIQUE: Transvaginal ultrasound was performed  for complete evaluation of the gestation as well as the maternal uterus, adnexal regions, and pelvic cul-de-sac. COMPARISON:  Pelvic ultrasound performed 01/01/2018 FINDINGS: Intrauterine gestational sac: None seen. Yolk sac:  N/A Embryo:  N/A Subchorionic hemorrhage:  None visualized. Maternal uterus/adnexae: The endometrial echo complex is somewhat thickened and heterogeneous in appearance, with cystic areas. This is of uncertain significance. There is no definite evidence of retained products of conception. The ovaries are within normal limits. There is no evidence of ovarian torsion. No suspicious adnexal masses are seen. Trace free fluid is seen within the pelvic cul-de-sac. IMPRESSION: No intrauterine gestational sac seen. Thickened endometrium with cystic areas, of uncertain significance. No definite evidence for retained products of conception. Given the patient's falling quantitative beta HCG, this reflects recent spontaneous abortion. If the quantitative beta HCG level rises, further evaluation would be helpful to exclude a molar pregnancy. Electronically Signed   By: Roanna Raider M.D.   On: 01/21/2018 01:17     Assessment/Plan: Possible incomplete SAb with endometritis. IV antibiotics ordered. I posted her for suction D&C,   The risks of surgery were discussed in detail with the patient including but not limited to: bleeding which may require transfusion or reoperation; infection which may require prolonged hospitalization or re-hospitalization and antibiotic therapy; injury to bowel, bladder, ureters and major vessels or other surrounding organs; need for additional procedures including laparotomy; thromboembolic phenomenon, incisional problems and other postoperative or anesthesia complications.  Patient was told that the likelihood that her condition and symptoms will be treated effectively with this surgical management was very high; the postoperative expectations were also discussed in  detail. The patient also understands the alternative treatment options which were discussed in full. All questions were answered.   Consults: None  Significant Diagnostic Studies: radiology: Ultrasound: pelvic US  Treatments: IV hydration and antibiotics: Cefotetan and doxycycline Surgery- suction D&E without complication Discharge Exam: Blood pressure (!) 97/54, pulse 67, temperature 97.8 F (36.6 C), temperature source Oral, resp. rate 16, height 5\' 6"  (1.676 m), weight 56.6 kg, last menstrual period 01/18/2018, SpO2 100 %, unknown if currently breastfeeding. General appearance: alert, cooperative and no distress Resp: normal effort GI: soft, non-tender; bowel sounds normal; no masses,  no organomegaly Extremities: extremities normal, atraumatic, no cyanosis or edema  Disposition: Discharge disposition: 01-Home or Self Care         Allergies as of 01/21/2018      Reactions   Peanuts [peanut Oil] Itching, Swelling      Medication List    STOP taking these medications   acetaminophen 500 MG tablet Commonly known  as:  TYLENOL   ibuprofen 200 MG tablet Commonly known as:  ADVIL,MOTRIN   misoprostol 200 MCG tablet Commonly known as:  CYTOTEC   ondansetron 4 MG disintegrating tablet Commonly known as:  ZOFRAN-ODT   prenatal vitamin w/FE, FA 27-1 MG Tabs tablet        Follow-up Information    [facility] Follow up in 2 week(s).   Specialty:  Obstetrics and Gynecology Contact information: [address] [place] [address] [phone]          Signed: Scheryl Darter 01/21/2018, 3:52 PM

## 2018-01-21 NOTE — Op Note (Signed)
Erika Bailey PROCEDURE DATE: 01/21/2018  PREOPERATIVE DIAGNOSIS: Unknown week incomplete abortion POSTOPERATIVE DIAGNOSIS: The same PROCEDURE:     Dilation and Evacuation SURGEON:  Scheryl DarterJames Arnold MD  INDICATIONS: 17 y.o. G1P0010 with incomplete at [redacted] weeks gestation by LMP but not confirmed by [place], needing surgical completion.  Risks of surgery were discussed with the patient including but not limited to: bleeding which may require transfusion; infection which may require antibiotics; injury to uterus or surrounding organs; need for additional procedures including laparotomy or laparoscopy; possibility of intrauterine scarring which may impair future fertility; and other postoperative/anesthesia complications. Written informed consent was obtained.    FINDINGS:  A 6-8 week size uterus, moderate amounts of products of conception, specimen sent to pathology.  ANESTHESIA:    GET, paracervical block. INTRAVENOUS FLUIDS:  1000 ml of LR ESTIMATED BLOOD LOSS:  Less than 50 ml. SPECIMENS:  Products of conception sent to pathology COMPLICATIONS:  None immediate.  PROCEDURE DETAILS:  The patient received intravenous Doxycycline while in the preoperative area.  She was then taken to the operating room where monitored intravenous sedation was administered and was found to be adequate.  After an adequate timeout was performed, she was placed in the dorsal lithotomy position and examined; then prepped and draped in the sterile manner.   Her bladder was catheterized for an unmeasured amount of clear, yellow urine. A vaginal speculum was then placed in the patient's vagina and a single tooth tenaculum was applied to the anterior lip of the cervix.  A paracervical block using 10 ml of 0.5% Marcaine with epinephrine 1:200000 was administered. The cervix was gently dilated to accommodate a 10 mm suction curette that was gently advanced to the uterine fundus.  The suction device was then activated and curette slowly  rotated to clear the uterus of products of conception. There was minimal bleeding noted and the tenaculum removed with good hemostasis noted.  All instruments were removed from the patient's vagina. Bleeding at the end of the procedure was managed with methergine and IV pitocin. Sponge and instrument counts were correct times two  The patient tolerated the procedure well and was taken to the recovery area awake, and in stable condition.  Adam PhenixArnold, James G, MD 01/21/2018 11:00 AM

## 2018-01-21 NOTE — Discharge Instructions (Signed)
Dilation and Curettage or Vacuum Curettage, Care After  These instructions give you information about caring for yourself after your procedure. Your doctor may also give you more specific instructions. Call your doctor if you have any problems or questions after your procedure.  Follow these instructions at home:  Activity   · Do not drive or use heavy machinery while taking prescription pain medicine.  · For 24 hours after your procedure, avoid driving.  · Take short walks often, followed by rest periods. Ask your doctor what activities are safe for you. After one or two days, you may be able to return to your normal activities.  · Do not lift anything that is heavier than 10 lb (4.5 kg) until your doctor approves.  · For at least 2 weeks, or as long as told by your doctor:  ? Do not douche.  ? Do not use tampons.  ? Do not have sex.  General instructions   · Take over-the-counter and prescription medicines only as told by your doctor. This is very important if you take blood thinning medicine.  · Do not take baths, swim, or use a hot tub until your doctor approves. Take showers instead of baths.  · Wear compression stockings as told by your doctor.  · It is up to you to get the results of your procedure. Ask your doctor when your results will be ready.  · Keep all follow-up visits as told by your doctor. This is important.  Contact a doctor if:  · You have very bad cramps that get worse or do not get better with medicine.  · You have very bad pain in your belly (abdomen).  · You cannot drink fluids without throwing up (vomiting).  · You get pain in a different part of the area between your belly and thighs (pelvis).  · You have bad-smelling discharge from your vagina.  · You have a rash.  Get help right away if:  · You are bleeding a lot from your vagina. A lot of bleeding means soaking more than one sanitary pad in an hour, for 2 hours in a row.  · You have clumps of blood (blood clots) coming from your  vagina.  · You have a fever or chills.  · Your belly feels very tender or hard.  · You have chest pain.  · You have trouble breathing.  · You cough up blood.  · You feel dizzy.  · You feel light-headed.  · You pass out (faint).  · You have pain in your neck or shoulder area.  Summary  · Take short walks often, followed by rest periods. Ask your doctor what activities are safe for you. After one or two days, you may be able to return to your normal activities.  · Do not lift anything that is heavier than 10 lb (4.5 kg) until your doctor approves.  · Do not take baths, swim, or use a hot tub until your doctor approves. Take showers instead of baths.  · Contact your doctor if you have any symptoms of infection, like bad-smelling discharge from your vagina.  This information is not intended to replace advice given to you by your health care provider. Make sure you discuss any questions you have with your health care provider.  Document Released: 12/05/2007 Document Revised: 11/13/2015 Document Reviewed: 11/13/2015  Elsevier Interactive Patient Education © 2017 [employer] Inc[employer]

## 2018-01-21 NOTE — Anesthesia Postprocedure Evaluation (Signed)
Anesthesia Post Note  Patient: Estill DoomsCameron D Rinehart  Procedure(s) Performed: DILATATION AND EVACUATION (N/A )     Patient location during evaluation: PACU Anesthesia Type: General Level of consciousness: awake and alert Pain management: pain level controlled Vital Signs Assessment: post-procedure vital signs reviewed and stable Respiratory status: spontaneous breathing, nonlabored ventilation, respiratory function stable and patient connected to nasal cannula oxygen Cardiovascular status: blood pressure returned to baseline and stable Postop Assessment: no apparent nausea or vomiting Anesthetic complications: no    Last Vitals:  Vitals:   01/21/18 1230 01/21/18 1354  BP: 98/64 (!) 97/54  Pulse: 67   Resp: 16   Temp: 36.7 C 36.6 C  SpO2: 100%     Last Pain:  Vitals:   01/21/18 1600  TempSrc:   PainSc: 0-No pain   Pain Goal: Patients Stated Pain Goal: 3 (01/21/18 0800)               Dellia Donnelly L Sebastiano Luecke

## 2018-01-22 ENCOUNTER — Telehealth: Payer: Self-pay | Admitting: *Deleted

## 2018-01-22 ENCOUNTER — Encounter (HOSPITAL_COMMUNITY): Payer: Self-pay | Admitting: Obstetrics & Gynecology

## 2018-01-22 ENCOUNTER — Ambulatory Visit: Payer: Medicaid Other

## 2018-01-22 NOTE — Telephone Encounter (Signed)
Call placed to pt to make her aware that HCG that was drawn today was not needed. Pt had D&C on 11/13 with quant level at that time. Per Dr Earlene Plateravis, Sharene Buttersquant level not needed today.  Pt was schedule for follow up in 2 weeks per D/C instructions.  Pt advised that if she has any other needs, to contact office.

## 2018-02-10 ENCOUNTER — Ambulatory Visit (INDEPENDENT_AMBULATORY_CARE_PROVIDER_SITE_OTHER): Payer: Medicaid Other | Admitting: Obstetrics & Gynecology

## 2018-02-10 ENCOUNTER — Encounter: Payer: Self-pay | Admitting: Obstetrics & Gynecology

## 2018-02-10 VITALS — BP 107/56 | HR 73 | Ht 67.0 in | Wt 129.4 lb

## 2018-02-10 DIAGNOSIS — Z30013 Encounter for initial prescription of injectable contraceptive: Secondary | ICD-10-CM | POA: Diagnosis not present

## 2018-02-10 DIAGNOSIS — Z9889 Other specified postprocedural states: Secondary | ICD-10-CM

## 2018-02-10 DIAGNOSIS — O034 Incomplete spontaneous abortion without complication: Secondary | ICD-10-CM

## 2018-02-10 MED ORDER — MEDROXYPROGESTERONE ACETATE 150 MG/ML IM SUSP
150.0000 mg | Freq: Once | INTRAMUSCULAR | Status: AC
  Administered 2018-02-10: 150 mg via INTRAMUSCULAR

## 2018-02-10 MED ORDER — MEDROXYPROGESTERONE ACETATE 150 MG/ML IM SUSP: 150.0000 mg | INTRAMUSCULAR | 3 refills | Status: DC

## 2018-02-10 NOTE — Progress Notes (Signed)
Presents for D&C FU.  She has no complaints today.  Wants DEPO. Patient said that she has not had sex since D&C.  Office Stock Depo given in LD, tolerated well. Next DEPO Feb. 18 - May 13, 2018  Administrations This Visit    medroxyPROGESTERone (DEPO-PROVERA) injection 150 mg    Admin Date 02/10/2018 Action Given Dose 150 mg Route Intramuscular Administered By Maretta BeesMcGlashan, Dawnya Grams J, RMA

## 2018-02-10 NOTE — Progress Notes (Signed)
Patient ID: Erika Bailey, female   DOB: 10/27/2000, 17 y.o.   MRN: [id number] Subjective:no problems reported     Erika DoomsCameron D Bailey is a 17 y.o. female who presents to the clinic 3 weeks status post suction D&C  for incomplete abortion. Eating a regular diet without difficulty. Bowel movements are normal. The patient is not having any pain.  The following portions of the patient's history were reviewed and updated as appropriate: allergies, current medications, past family history, past medical history, past social history, past surgical history and problem list.  Review of Systems Pertinent items are noted in HPI.    Objective:    BP (!) 107/56   Pulse 73   Ht 5\' 7"  (1.702 m)   Wt 129 lb 6.4 oz (58.7 kg)   LMP 01/18/2018   BMI 20.27 kg/m  General:  alert, cooperative and no distress  Abdomen: non-tender  Incision:   n/a     Assessment:    Doing well postoperatively. Operative findings again reviewed. Pathology report discussed.    Plan:    1. Continue any current medications. 2. Wound care discussed. 3. Activity restrictions: none 4. Anticipated return to work: now. 5. Follow up: 3 months Depo Provera, requests start today. She has not had intercourse since the procedure   Adam PhenixArnold, James G, MD 02/10/2018

## 2018-02-10 NOTE — Patient Instructions (Signed)
Hormonal Contraception Information °Hormonal contraception is a type of birth control that uses hormones to prevent pregnancy. It usually involves a combination of the hormones estrogen and progesterone or only the hormone progesterone. Hormonal contraception works in these ways: °· It thickens the mucus in the cervix, making it harder for sperm to enter the uterus. °· It changes the lining of the uterus, making it harder for an egg to implant. °· It may stop the ovaries from releasing eggs (ovulation). Some women who take hormonal contraceptives that contain only progesterone may continue to ovulate. ° °Hormonal contraception cannot prevent sexually transmitted infections (STIs). Pregnancy may still occur. °Estrogen and progesterone contraceptives °Contraceptives that use a combination of estrogen and progesterone are available in these forms: °· Pill. Pills come in different combinations of hormones. They must be taken at the same time each day. Pills can affect your period, causing you to get your period once every three months or not at all. °· Patch. The patch must be worn on the lower abdomen for three weeks and then removed on the fourth. °· Vaginal ring. The ring is placed in the vagina and left there for three weeks. It is then removed for one week. ° °Progesterone contraceptives °Contraceptives that use progesterone only are available in these forms: °· Pill. Pills should be taken every day of the cycle. °· Intrauterine device (IUD). This device is inserted into the uterus and removed or replaced every five years or sooner. °· Implant. Plastic rods are placed under the skin of the upper arm. They are removed or replaced every three years or sooner. °· Injection. The injection is given once every 90 days. ° °What are the side effects? °The side effects of estrogen and progesterone contraceptives include: °· Nausea. °· Headaches. °· Breast tenderness. °· Bleeding or spotting between menstrual cycles. °· High  blood pressure (rare). °· Strokes, heart attacks, or blood clots (rare) ° °Side effects of progesterone-only contraceptives include: °· Nausea. °· Headaches. °· Breast tenderness. °· Unpredictable menstrual bleeding. °· High blood pressure (rare). ° °Talk to your health care provider about what side effects may affect you. °Where to find more information: °· Ask your health care provider for more information and resources about hormonal contraception. °· [place] Department of Health and Human Services Office on Women's Health: www.womenshealth.gov °Questions to ask: °· What type of hormonal contraception is right for me? °· How long should I plan to use hormonal contraception? °· What are the side effects of the hormonal contraception method I choose? °· How can I prevent STIs while using hormonal contraception? °Contact a health care provider if: °· You start taking hormonal contraceptives and you develop persistent or severe side effects. °Summary °· Estrogen and progesterone are hormones used in many forms of birth control. °· Talk to your health care provider about what side effects may affect you. °· Hormonal contraception cannot prevent sexually transmitted infections (STIs). °· Ask your health care provider for more information and resources about hormonal contraception. °This information is not intended to replace advice given to you by your health care provider. Make sure you discuss any questions you have with your health care provider. °Document Released: 03/17/2007 Document Revised: 01/26/2016 Document Reviewed: 01/26/2016 °Elsevier Interactive Patient Education © 2018 Elsevier Inc. ° °

## 2018-05-01 ENCOUNTER — Emergency Department (HOSPITAL_COMMUNITY)
Admission: EM | Admit: 2018-05-01 | Discharge: 2018-05-01 | Disposition: A | Discharge status: Home / Self Care | Payer: No Typology Code available for payment source | Attending: Emergency Medicine | Admitting: Emergency Medicine

## 2018-05-01 ENCOUNTER — Encounter: Payer: Self-pay | Admitting: Emergency Medicine

## 2018-05-01 DIAGNOSIS — Y9289 Other specified places as the place of occurrence of the external cause: Secondary | ICD-10-CM | POA: Diagnosis not present

## 2018-05-01 DIAGNOSIS — S61011A Laceration without foreign body of right thumb without damage to nail, initial encounter: Secondary | ICD-10-CM | POA: Diagnosis not present

## 2018-05-01 DIAGNOSIS — Y9389 Activity, other specified: Secondary | ICD-10-CM | POA: Insufficient documentation

## 2018-05-01 DIAGNOSIS — Y990 Civilian activity done for income or pay: Secondary | ICD-10-CM | POA: Diagnosis not present

## 2018-05-01 DIAGNOSIS — W260XXA Contact with knife, initial encounter: Secondary | ICD-10-CM | POA: Diagnosis not present

## 2018-05-01 DIAGNOSIS — Z23 Encounter for immunization: Secondary | ICD-10-CM | POA: Diagnosis not present

## 2018-05-01 MED ORDER — LIDOCAINE HCL 2 % IJ SOLN
10.0000 mL | Freq: Once | INTRAMUSCULAR | Status: AC
  Administered 2018-05-01: 200 mg via INTRADERMAL
  Filled 2018-05-01: qty 20

## 2018-05-01 MED ORDER — IBUPROFEN 600 MG PO TABS: 600.0000 mg | ORAL_TABLET | Freq: Four times a day (QID) | ORAL | 0 refills | Status: DC | PRN

## 2018-05-01 MED ORDER — TETANUS-DIPHTH-ACELL PERTUSSIS 5-2.5-18.5 LF-MCG/0.5 IM SUSP
0.5000 mL | Freq: Once | INTRAMUSCULAR | Status: AC
  Administered 2018-05-01: 0.5 mL via INTRAMUSCULAR
  Filled 2018-05-01: qty 0.5

## 2018-05-01 NOTE — ED Notes (Signed)
Wound care done to left thumb.

## 2018-05-01 NOTE — Discharge Instructions (Signed)
Please have your sutures remove in 7 days. Monitor for any signs of infection.

## 2018-05-01 NOTE — ED Triage Notes (Signed)
Pt presents for evaluation of laceration to L thumb from box cutter. Bleeding is controlled.

## 2018-05-01 NOTE — ED Provider Notes (Signed)
[facility] EMERGENCY DEPARTMENT Provider Note   CSN: [id number] Arrival date & time: 05/01/18  1010    History   Chief Complaint Chief Complaint  Patient presents with  . Extremity Laceration    HPI Erika Bailey is a 18 y.o. female.     The history is provided by the patient. No language interpreter was used.    18 year old female presenting for evaluation finger laceration.  Patient accidentally cut her left thumb with a box cutter while at work prior to arrival.  She report acute onset of sharp throbbing moderate pain to the thumb nonradiating without any numbness.  She denies any other injury.  She is not up-to-date with tetanus.  No specific treatment tried.  Pt is R hand dominant.    Past Medical History:  Diagnosis Date  . Supervision of other normal pregnancy, antepartum 12/18/2017    Nursing Staff Provider Office Location   [facility] Dating   LMP Language   English Anatomy US   Flu Vaccine   12/18/17 Genetic Screen  NIPS:   AFP:   First Screen:  Quad:   TDaP vaccine    Hgb A1C or  GTT Early  Third trimester  Rhogam     LAB RESULTS  Feeding Plan  Breast Blood Type    Contraception  Antibody   Circumcision  Rubella   Pediatrician   RPR    Support Person  HBsAg    Prenatal Classes     Patient Active Problem List   Diagnosis Date Noted  . Encounter for prescription for depo-Provera 02/10/2018  . Incomplete abortion 01/21/2018    Past Surgical History:  Procedure Laterality Date  . DILATION AND EVACUATION N/A 01/21/2018   Procedure: DILATATION AND EVACUATION;  Surgeon: Adam Phenix, MD;  Location: [facility];  Service: Gynecology;  Laterality: N/A;     OB History    Gravida  1   Para      Term      Preterm      AB  1   Living        SAB  1   TAB      Ectopic      Multiple      Live Births               Home Medications    Prior to Admission medications   Medication Sig Start Date End Date Taking? Authorizing Provider   medroxyPROGESTERone (DEPO-PROVERA) 150 MG/ML injection Inject 1 mL (150 mg total) into the muscle every 3 (three) months. 02/10/18   Adam Phenix, MD    Family History Family History  Problem Relation Age of Onset  . Healthy Mother   . Healthy Father   . Cancer Maternal Grandmother     Social History Social History   Tobacco Use  . Smoking status: Former Games developer  . Smokeless tobacco: Never Used  Substance Use Topics  . Alcohol use: No  . Drug use: Never     Allergies   Peanuts [peanut oil]   Review of Systems Review of Systems  Constitutional: Negative for fever.  Skin: Positive for wound.  Neurological: Negative for numbness.     Physical Exam Updated Vital Signs BP 114/83 (BP Location: Right Arm)   Pulse 80   Temp 98.4 F (36.9 C) (Oral)   Resp 16   LMP 04/11/2018 (Approximate)   SpO2 100%   Physical Exam Vitals signs and nursing note reviewed.  Constitutional:      General: She is not in acute distress.    Appearance: She is well-developed.  HENT:     Head: Atraumatic.  Eyes:     Conjunctiva/sclera: Conjunctivae normal.  Neck:     Musculoskeletal: Neck supple.  Musculoskeletal:        General: Signs of injury (R thumb: a 1cm superficial horizontal lac noted to ulnar aspect of proximal phalanx without joint involvement, no fb noted.  sensation intact distally.) present.  Skin:    Findings: No rash.  Neurological:     Mental Status: She is alert.      ED Treatments / Results  Labs (all labs ordered are listed, but only abnormal results are displayed) Labs Reviewed - No data to display  EKG None  Radiology No results found.  Procedures .Marland KitchenLaceration Repair Date/Time: 05/01/2018 11:44 AM Performed by: Fayrene Helper, PA-C Authorized by: Fayrene Helper, PA-C   Consent:    Consent obtained:  Verbal   Consent given by:  Patient   Risks discussed:  Infection, need for additional repair, pain, poor cosmetic result and poor wound healing    Alternatives discussed:  No treatment and delayed treatment Universal protocol:    Procedure explained and questions answered to patient or proxy's satisfaction: yes     Relevant documents present and verified: yes     Test results available and properly labeled: yes     Imaging studies available: yes     Required blood products, implants, devices, and special equipment available: yes     Site/side marked: yes     Immediately prior to procedure, a time out was called: yes     Patient identity confirmed:  Verbally with patient Anesthesia (see MAR for exact dosages):    Anesthesia method:  Local infiltration   Local anesthetic:  Lidocaine 1% w/o epi Laceration details:    Location:  Finger   Finger location:  R thumb   Length (cm):  1   Depth (mm):  3 Repair type:    Repair type:  Simple Pre-procedure details:    Preparation:  Patient was prepped and draped in usual sterile fashion Exploration:    Contaminated: no   Treatment:    Area cleansed with:  Saline   Amount of cleaning:  Standard   Irrigation solution:  Sterile saline Skin repair:    Repair method:  Sutures   Suture size:  5-0   Suture material:  Prolene   Suture technique:  Simple interrupted   Number of sutures:  3 Approximation:    Approximation:  Close Post-procedure details:    Dressing:  Non-adherent dressing   Patient tolerance of procedure:  Tolerated well, no immediate complications   (including critical care time)  Medications Ordered in ED Medications  lidocaine (XYLOCAINE) 2 % (with pres) injection 200 mg (200 mg Intradermal Given by Other 05/01/18 1107)  Tdap (BOOSTRIX) injection 0.5 mL (0.5 mLs Intramuscular Given 05/01/18 1105)     Initial Impression / Assessment and Plan / ED Course  I have reviewed the triage vital signs and the nursing notes.  Pertinent labs & imaging results that were available during my care of the patient were reviewed by me and considered in my medical decision making  (see chart for details).        BP 114/83 (BP Location: Right Arm)   Pulse 80   Temp 98.4 F (36.9 C) (Oral)   Resp 16   LMP 04/11/2018 (Approximate)   SpO2 100%  Final Clinical Impressions(s) / ED Diagnoses   Final diagnoses:  Laceration of right thumb without foreign body without damage to nail, initial encounter    ED Discharge Orders         Ordered    ibuprofen (ADVIL,MOTRIN) 600 MG tablet  Every 6 hours PRN     05/01/18 1151         11:09 AM Superficial lac to R thumb on ulnar aspect, will perform lac repair.   11:49 AM Successful lac repair.  Recommend sutures removal in 7 days. Wound care instruction given.    [name], Kamy Poinsett, PA-C 05/01/18 1152    Arby BarrettePfeiffer, Marcy, MD 05/02/18 1459

## 2018-05-05 ENCOUNTER — Ambulatory Visit: Payer: Medicaid Other

## 2018-05-06 ENCOUNTER — Telehealth: Payer: Self-pay | Admitting: Obstetrics

## 2018-05-06 NOTE — Telephone Encounter (Signed)
Attempted to call to reschedule missed Depo appointment, unable to LVM, mail box was full. Must reschedule by 05/13/2018

## 2018-05-18 ENCOUNTER — Other Ambulatory Visit: Payer: Self-pay

## 2018-08-18 ENCOUNTER — Encounter (HOSPITAL_COMMUNITY): Payer: Self-pay

## 2018-08-18 ENCOUNTER — Ambulatory Visit (HOSPITAL_COMMUNITY)
Admission: EM | Admit: 2018-08-18 | Discharge: 2018-08-18 | Disposition: A | Discharge status: Home / Self Care | Payer: Medicaid Other | Attending: Family Medicine | Admitting: Family Medicine

## 2018-08-18 ENCOUNTER — Other Ambulatory Visit: Payer: Self-pay

## 2018-08-18 DIAGNOSIS — Z8619 Personal history of other infectious and parasitic diseases: Secondary | ICD-10-CM

## 2018-08-18 DIAGNOSIS — N898 Other specified noninflammatory disorders of vagina: Secondary | ICD-10-CM

## 2018-08-18 MED ORDER — AZITHROMYCIN 250 MG PO TABS
ORAL_TABLET | ORAL | Status: AC
  Filled 2018-08-18: qty 4

## 2018-08-18 MED ORDER — CEFTRIAXONE SODIUM 250 MG IJ SOLR
INTRAMUSCULAR | Status: AC
  Filled 2018-08-18: qty 250

## 2018-08-18 MED ORDER — AZITHROMYCIN 250 MG PO TABS
1000.0000 mg | ORAL_TABLET | Freq: Once | ORAL | Status: AC
  Administered 2018-08-18: 1000 mg via ORAL

## 2018-08-18 MED ORDER — CEFTRIAXONE SODIUM 250 MG IJ SOLR
250.0000 mg | Freq: Once | INTRAMUSCULAR | Status: AC
  Administered 2018-08-18: 250 mg via INTRAMUSCULAR

## 2018-08-18 NOTE — Discharge Instructions (Addendum)
We are treating you for gonorrhea and chlamydia today. Sending your swab for testing and we will call you with any positive results

## 2018-08-18 NOTE — ED Triage Notes (Signed)
Pt presents with vaginal discharge X 7 days.

## 2018-08-18 NOTE — ED Provider Notes (Signed)
Erika Bailey    CSN: [id number] Arrival date & time: 08/18/18  1001     History   Chief Complaint Chief Complaint  Patient presents with  . Vaginal Discharge    HPI Erika Bailey is a 18 y.o. female.   Patient is a 18 year old female presents today with vaginal discharge x7 days.  Symptoms have been constant.  She describes the discharge as yellow/greenish and watery.  History of same with gonorrhea.  Denies any itching or irritation to the vaginal area.  Denies any bleeding.  No abdominal pain, back pain, flank pain or pelvic pain.  No fevers.  No dysuria, hematuria or urinary frequency.  She is currently sexually active with one partner, unprotected. Patient's last menstrual period was 08/11/2018.   ROS per HPI      Past Medical History:  Diagnosis Date  . Supervision of other normal pregnancy, antepartum 12/18/2017    Nursing Staff Provider Office Location   [facility] Dating   LMP Language   English Anatomy US   Flu Vaccine   12/18/17 Genetic Screen  NIPS:   AFP:   First Screen:  Quad:   TDaP vaccine    Hgb A1C or  GTT Early  Third trimester  Rhogam     LAB RESULTS  Feeding Plan  Breast Blood Type    Contraception  Antibody   Circumcision  Rubella   Pediatrician   RPR    Support Person  HBsAg    Prenatal Classes     Patient Active Problem List   Diagnosis Date Noted  . Encounter for prescription for depo-Provera 02/10/2018  . Incomplete abortion 01/21/2018    Past Surgical History:  Procedure Laterality Date  . DILATION AND EVACUATION N/A 01/21/2018   Procedure: DILATATION AND EVACUATION;  Surgeon: Woodroe Mode, MD;  Location: [facility];  Service: Gynecology;  Laterality: N/A;    OB History    Gravida  1   Para      Term      Preterm      AB  1   Living        SAB  1   TAB      Ectopic      Multiple      Live Births               Home Medications    Prior to Admission medications   Medication Sig Start Date End Date Taking?  Authorizing Provider  ibuprofen (ADVIL,MOTRIN) 600 MG tablet Take 1 tablet (600 mg total) by mouth every 6 (six) hours as needed. 05/01/18   Domenic Moras, PA-C  medroxyPROGESTERone (DEPO-PROVERA) 150 MG/ML injection Inject 1 mL (150 mg total) into the muscle every 3 (three) months. 02/10/18   Woodroe Mode, MD    Family History Family History  Problem Relation Age of Onset  . Healthy Mother   . Healthy Father   . Cancer Maternal Grandmother     Social History Social History   Tobacco Use  . Smoking status: Former Research scientist (life sciences)  . Smokeless tobacco: Never Used  Substance Use Topics  . Alcohol use: No  . Drug use: Never     Allergies   Peanuts [peanut oil]   Review of Systems Review of Systems   Physical Exam Triage Vital Signs ED Triage Vitals  Enc Vitals Group     BP 08/18/18 1023 114/69     Pulse Rate 08/18/18 1023 63     Resp 08/18/18 1023  18     Temp 08/18/18 1023 98.1 F (36.7 C)     Temp Source 08/18/18 1023 Oral     SpO2 08/18/18 1023 100 %     Weight --      Height --      Head Circumference --      Peak Flow --      Pain Score 08/18/18 1024 5     Pain Loc --      Pain Edu? --      Excl. in GC? --    No data found.  Updated Vital Signs BP 114/69 (BP Location: Left Arm)   Pulse 63   Temp 98.1 F (36.7 C) (Oral)   Resp 18   LMP 08/11/2018   SpO2 100%   Visual Acuity Right Eye Distance:   Left Eye Distance:   Bilateral Distance:    Right Eye Near:   Left Eye Near:    Bilateral Near:     Physical Exam Vitals signs and nursing note reviewed.  Constitutional:      General: She is not in acute distress.    Appearance: Normal appearance. She is not ill-appearing, toxic-appearing or diaphoretic.  HENT:     Head: Normocephalic.     Nose: Nose normal.     Mouth/Throat:     Pharynx: Oropharynx is clear.  Eyes:     Conjunctiva/sclera: Conjunctivae normal.  Neck:     Musculoskeletal: Normal range of motion.  Pulmonary:     Effort: Pulmonary  effort is normal.  Abdominal:     Palpations: Abdomen is soft.     Tenderness: There is no abdominal tenderness.  Musculoskeletal: Normal range of motion.  Skin:    General: Skin is warm and dry.     Findings: No rash.  Neurological:     Mental Status: She is alert.  Psychiatric:        Mood and Affect: Mood normal.      UC Treatments / Results  Labs (all labs ordered are listed, but only abnormal results are displayed) Labs Reviewed  CERVICOVAGINAL ANCILLARY ONLY    EKG None  Radiology No results found.  Procedures Procedures (including critical care time)  Medications Ordered in UC Medications  cefTRIAXone (ROCEPHIN) injection 250 mg (has no administration in time range)  azithromycin (ZITHROMAX) tablet 1,000 mg (has no administration in time range)    Initial Impression / Assessment and Plan / UC Course  I have reviewed the triage vital signs and the nursing notes.  Pertinent labs & imaging results that were available during my care of the patient were reviewed by me and considered in my medical decision making (see chart for details).     Patient is a 18 year old female with 1 week of vaginal discharge similar to previous in the past with gonorrhea. Will treat for gonorrhea and chlamydia today here in clinic Sending swab for testing and lab results are pending Final Clinical Impressions(s) / UC Diagnoses   Final diagnoses:  Vaginal discharge     Discharge Instructions     We are treating you for gonorrhea and chlamydia today. Sending your swab for testing and we will call you with any positive results    ED Prescriptions    None     Controlled Substance Prescriptions [place] Controlled Substance Registry consulted? Not Applicable   Janace ArisBast, Chiamaka Latka A, NP 08/18/18 1105

## 2018-08-19 LAB — CERVICOVAGINAL ANCILLARY ONLY
Bacterial vaginitis: POSITIVE — AB
Candida vaginitis: NEGATIVE
Chlamydia: POSITIVE — AB
Neisseria Gonorrhea: POSITIVE — AB
Trichomonas: NEGATIVE

## 2018-08-20 ENCOUNTER — Telehealth (HOSPITAL_COMMUNITY): Payer: Self-pay | Admitting: Emergency Medicine

## 2018-08-20 MED ORDER — METRONIDAZOLE 500 MG PO TABS: 500.0000 mg | ORAL_TABLET | Freq: Two times a day (BID) | ORAL | 0 refills | Status: AC

## 2018-08-20 NOTE — Telephone Encounter (Signed)
Bacterial vaginosis is positive. This was not treated at the urgent care visit.  Flagyl 500 mg BID x 7 days #14 no refills sent to patients pharmacy of choice.    Chlamydia is positive.  This was treated at the urgent care visit with po zithromax 1g.  Pt needs education to please refrain from sexual intercourse for 7 days to give the medicine time to work.  Sexual partners need to be notified and tested/treated.  Condoms may reduce risk of reinfection.  Recheck or followup with PCP for further evaluation if symptoms are not improving.  [facility] notified.  Test for gonorrhea was positive. This was treated at the urgent care visit with IM rocephin 250mg and po zithromax 1g. Pt needs education to refrain from sexual intercourse for 7 days after treatment to give the medicine time to work. Sexual partners need to be notified and tested/treated. Condoms may reduce risk of reinfection. Recheck or followup with PCP for further evaluation if symptoms are not improving. [facility] notified.   Patient contacted and made aware of all results, all questions answered.   

## 2018-09-19 ENCOUNTER — Other Ambulatory Visit: Payer: Self-pay

## 2018-09-19 ENCOUNTER — Encounter (HOSPITAL_COMMUNITY): Payer: Self-pay | Admitting: *Deleted

## 2018-09-19 ENCOUNTER — Emergency Department (HOSPITAL_COMMUNITY)
Admission: EM | Admit: 2018-09-19 | Discharge: 2018-09-19 | Disposition: A | Discharge status: Home / Self Care | Payer: Medicaid Other | Attending: Emergency Medicine | Admitting: Emergency Medicine

## 2018-09-19 DIAGNOSIS — N898 Other specified noninflammatory disorders of vagina: Secondary | ICD-10-CM

## 2018-09-19 DIAGNOSIS — Z87891 Personal history of nicotine dependence: Secondary | ICD-10-CM | POA: Diagnosis not present

## 2018-09-19 DIAGNOSIS — N760 Acute vaginitis: Secondary | ICD-10-CM | POA: Insufficient documentation

## 2018-09-19 DIAGNOSIS — B9689 Other specified bacterial agents as the cause of diseases classified elsewhere: Secondary | ICD-10-CM | POA: Diagnosis not present

## 2018-09-19 DIAGNOSIS — Z202 Contact with and (suspected) exposure to infections with a predominantly sexual mode of transmission: Secondary | ICD-10-CM | POA: Diagnosis not present

## 2018-09-19 LAB — WET PREP, GENITAL
Sperm: NONE SEEN
Trich, Wet Prep: NONE SEEN
Yeast Wet Prep HPF POC: NONE SEEN

## 2018-09-19 LAB — URINALYSIS, ROUTINE W REFLEX MICROSCOPIC
Bilirubin Urine: NEGATIVE
Glucose, UA: NEGATIVE mg/dL
Hgb urine dipstick: NEGATIVE
Ketones, ur: NEGATIVE mg/dL
Nitrite: NEGATIVE
Protein, ur: NEGATIVE mg/dL
Specific Gravity, Urine: 1.009 (ref 1.005–1.030)
pH: 7 (ref 5.0–8.0)

## 2018-09-19 LAB — PREGNANCY, URINE: Preg Test, Ur: NEGATIVE

## 2018-09-19 MED ORDER — AZITHROMYCIN 250 MG PO TABS
1000.0000 mg | ORAL_TABLET | Freq: Once | ORAL | Status: AC
  Administered 2018-09-19: 1000 mg via ORAL
  Filled 2018-09-19: qty 4

## 2018-09-19 MED ORDER — LIDOCAINE HCL (PF) 1 % IJ SOLN
INTRAMUSCULAR | Status: AC
  Filled 2018-09-19: qty 5

## 2018-09-19 MED ORDER — METRONIDAZOLE 500 MG PO TABS: 500.0000 mg | ORAL_TABLET | Freq: Two times a day (BID) | ORAL | 0 refills | Status: DC

## 2018-09-19 MED ORDER — CEFTRIAXONE SODIUM 250 MG IJ SOLR
250.0000 mg | Freq: Once | INTRAMUSCULAR | Status: AC
  Administered 2018-09-19: 250 mg via INTRAMUSCULAR
  Filled 2018-09-19: qty 250

## 2018-09-19 NOTE — Discharge Instructions (Signed)
You were seen in the emergency department today for vaginal discharge.  Your wet prep showed findings consistent with bacterial vaginosis, please see the attached handout.  We are treating this with Flagyl, an antibiotic, take this for the next 1 week, do not drink alcohol with this as it is extremely dangerous.  We have prescribed you new medication(s) today. Discuss the medications prescribed today with your pharmacist as they can have adverse effects and interactions with your other medicines including over the counter and prescribed medications. Seek medical evaluation if you start to experience new or abnormal symptoms after taking one of these medicines, seek care immediately if you start to experience difficulty breathing, feeling of your throat closing, facial swelling, or rash as these could be indications of a more serious allergic reaction

## 2018-09-19 NOTE — ED Triage Notes (Signed)
Patient states  She thinks she has an STD c/lo vaginal discharge and itching onset  Last week.

## 2018-09-19 NOTE — ED Provider Notes (Signed)
[facility]   CSN: [id number] Arrival date & time: 09/19/18  [id number]    History   Chief Complaint Chief Complaint  Patient presents with  . Exposure to STD    HPI Erika DoomsCameron D Bailey is a 18 y.o. female with a hx of former tobacco use who presents to the ED with complaints of vaginal discharge for the past few days. She reports discharge is white & pruritic, constant, no alleviating/aggravating factors. She notes she occasionally will get brief bilateral pelvic pain, but this is not constant or occurring at present. Denies fever, chills, nausea, vomiting, or dysuria. She has had 2 sexual partners within the past 6 months- did not use protection w/ 1 who tested positive for GC/chlamydia. LMP 2 weeks prior.      HPI  Past Medical History:  Diagnosis Date  . Supervision of other normal pregnancy, antepartum 12/18/2017    Nursing Staff Provider Office Location   [facility] Dating   LMP Language   English Anatomy US   Flu Vaccine   12/18/17 Genetic Screen  NIPS:   AFP:   First Screen:  Quad:   TDaP vaccine    Hgb A1C or  GTT Early  Third trimester  Rhogam     LAB RESULTS  Feeding Plan  Breast Blood Type    Contraception  Antibody   Circumcision  Rubella   Pediatrician   RPR    Support Person  HBsAg    Prenatal Classes     Patient Active Problem List   Diagnosis Date Noted  . Encounter for prescription for depo-Provera 02/10/2018  . Incomplete abortion 01/21/2018    Past Surgical History:  Procedure Laterality Date  . DILATION AND EVACUATION N/A 01/21/2018   Procedure: DILATATION AND EVACUATION;  Surgeon: Adam PhenixArnold, James G, MD;  Location: [facility];  Service: Gynecology;  Laterality: N/A;     OB History    Gravida  1   Para      Term      Preterm      AB  1   Living        SAB  1   TAB      Ectopic      Multiple      Live Births               Home Medications    Prior to Admission medications   Medication Sig  Start Date End Date Taking? Authorizing Provider  ibuprofen (ADVIL,MOTRIN) 600 MG tablet Take 1 tablet (600 mg total) by mouth every 6 (six) hours as needed. 05/01/18   [name], [place], PA-C  medroxyPROGESTERone (DEPO-PROVERA) 150 MG/ML injection Inject 1 mL (150 mg total) into the muscle every 3 (three) months. 02/10/18   Adam PhenixArnold, James G, MD    Family History Family History  Problem Relation Age of Onset  . Healthy Mother   . Healthy Father   . Cancer Maternal Grandmother     Social History Social History   Tobacco Use  . Smoking status: Former Games developermoker  . Smokeless tobacco: Never Used  Substance Use Topics  . Alcohol use: Yes  . Drug use: Never     Allergies   Peanuts [peanut oil]   Review of Systems Review of Systems  Constitutional: Negative for chills and fever.  Respiratory: Negative for shortness of breath.   Cardiovascular: Negative for chest pain.  Gastrointestinal: Negative for diarrhea, nausea and vomiting.  Genitourinary: Positive for vaginal discharge. Negative  for dysuria, genital sores and vaginal bleeding.  All other systems reviewed and are negative.   Physical Exam Updated Vital Signs BP 117/73 (BP Location: Right Arm)   Pulse 74   Temp 98 F (36.7 C) (Oral)   Resp 12   Ht 5\' 7"  (1.702 m)   Wt 59 kg   SpO2 100%   BMI 20.36 kg/m   Physical Exam Vitals signs and nursing note reviewed. Exam conducted with a chaperone present.  Constitutional:      General: She is not in acute distress.    Appearance: She is well-developed. She is not toxic-appearing.  HENT:     Head: Normocephalic and atraumatic.  Eyes:     General:        Right eye: No discharge.        Left eye: No discharge.     Conjunctiva/sclera: Conjunctivae normal.  Neck:     Musculoskeletal: Neck supple.  Cardiovascular:     Rate and Rhythm: Normal rate and regular rhythm.  Pulmonary:     Effort: Pulmonary effort is normal. No respiratory distress.     Breath sounds: Normal breath  sounds. No wheezing, rhonchi or rales.  Abdominal:     General: There is no distension.     Palpations: Abdomen is soft.     Tenderness: There is no abdominal tenderness. There is no guarding or rebound.  Genitourinary:    Labia:        Right: No rash or lesion.        Left: No rash or lesion.      Vagina: Vaginal discharge (thick white large amount of discharge in vaginal vault) present.     Cervix: No cervical motion tenderness or friability.     Adnexa:        Right: No mass, tenderness or fullness.         Left: No mass, tenderness or fullness.       Comments: Lew DawesKelly Moon RN present as chaperone.  Skin:    General: Skin is warm and dry.     Findings: No rash.  Neurological:     Mental Status: She is alert.     Comments: Clear speech.   Psychiatric:        Behavior: Behavior normal.    ED Treatments / Results  Labs (all labs ordered are listed, but only abnormal results are displayed) Labs Reviewed - No data to display  EKG None  Radiology No results found.  Procedures Procedures (including critical care time)  Medications Ordered in ED Medications - No data to display   Initial Impression / Assessment and Plan / ED Course  I have reviewed the triage vital signs and the nursing notes.  Pertinent labs & imaging results that were available during my care of the patient were reviewed by me and considered in my medical decision making (see chart for details).   Patient presents to the ED w/ vaginal discharge & intermittent pelvic pain for the past few days. Nontoxic appearing, no apparent distress, vitals WNL. On exam patient has large amount of white vaginal discharge. While patient reports mild intermittent pelvic pain she has absolutely no tenderness or peritoneal signs on abdominal exam, no cervical motion tenderness, & no adnexal tenderness- exam does not seem consistent w/ PID. Additionally w/ nontender exam & intermittent bilateral pain doubt torsion. Preg  negative, doubt ectopic. UA w/ small leuks, rare bacteria, no urinary sxs- doubt UTI. Wet prep w/ BV- will cover w/  flagyl discussed no ETOH with this medicine. Will also cover for GC/chlamydia in the ED w/ rocephin & azithromycin. GC, chlamydia, RPR, & HIV pending- discussed need to inform sexual partners of positive results. We discussed abstinence for 2 weeks to ensure no re-infection. We also discussed importance of protection when sexually active. I discussed results, treatment plan, need for follow-up, and return precautions with the patient. Provided opportunity for questions, patient confirmed understanding and is in agreement with plan.   Final Clinical Impressions(s) / ED Diagnoses   Final diagnoses:  Vaginal discharge  BV (bacterial vaginosis)    ED Discharge Orders         Ordered    metroNIDAZOLE (FLAGYL) 500 MG tablet  2 times daily     09/19/18 0914           Micaila Ziemba, Glynda Jaeger, PA-C 09/19/18 0915    Charlesetta Shanks, MD 09/20/18 [phone]

## 2018-09-20 LAB — RPR: RPR Ser Ql: NONREACTIVE

## 2018-09-20 LAB — HIV ANTIBODY (ROUTINE TESTING W REFLEX): HIV Screen 4th Generation wRfx: NONREACTIVE

## 2018-09-22 LAB — GC/CHLAMYDIA PROBE AMP (~~LOC~~) NOT AT ARMC
Chlamydia: NEGATIVE
Neisseria Gonorrhea: POSITIVE — AB

## 2018-11-05 ENCOUNTER — Other Ambulatory Visit: Payer: Self-pay

## 2018-11-05 ENCOUNTER — Encounter (HOSPITAL_COMMUNITY): Payer: Self-pay | Admitting: Emergency Medicine

## 2018-11-05 ENCOUNTER — Ambulatory Visit (HOSPITAL_COMMUNITY)
Admission: EM | Admit: 2018-11-05 | Discharge: 2018-11-05 | Disposition: A | Discharge status: Home / Self Care | Payer: Medicaid Other | Attending: Family Medicine | Admitting: Family Medicine

## 2018-11-05 DIAGNOSIS — Z7251 High risk heterosexual behavior: Secondary | ICD-10-CM | POA: Diagnosis not present

## 2018-11-05 DIAGNOSIS — N898 Other specified noninflammatory disorders of vagina: Secondary | ICD-10-CM | POA: Diagnosis not present

## 2018-11-05 DIAGNOSIS — R1030 Lower abdominal pain, unspecified: Secondary | ICD-10-CM | POA: Diagnosis not present

## 2018-11-05 DIAGNOSIS — Z202 Contact with and (suspected) exposure to infections with a predominantly sexual mode of transmission: Secondary | ICD-10-CM | POA: Diagnosis not present

## 2018-11-05 LAB — POCT URINALYSIS DIP (DEVICE)
Bilirubin Urine: NEGATIVE
Glucose, UA: NEGATIVE mg/dL
Ketones, ur: NEGATIVE mg/dL
Nitrite: NEGATIVE
Protein, ur: NEGATIVE mg/dL
Specific Gravity, Urine: 1.025 (ref 1.005–1.030)
Urobilinogen, UA: 0.2 mg/dL (ref 0.0–1.0)
pH: 6 (ref 5.0–8.0)

## 2018-11-05 LAB — POCT PREGNANCY, URINE: Preg Test, Ur: NEGATIVE

## 2018-11-05 MED ORDER — AZITHROMYCIN 250 MG PO TABS
ORAL_TABLET | ORAL | Status: AC
  Filled 2018-11-05: qty 4

## 2018-11-05 MED ORDER — CEFTRIAXONE SODIUM 250 MG IJ SOLR
INTRAMUSCULAR | Status: AC
  Filled 2018-11-05: qty 250

## 2018-11-05 MED ORDER — AZITHROMYCIN 250 MG PO TABS
1000.0000 mg | ORAL_TABLET | Freq: Once | ORAL | Status: AC
  Administered 2018-11-05: 20:00:00 1000 mg via ORAL

## 2018-11-05 MED ORDER — LIDOCAINE HCL 2 % IJ SOLN
INTRAMUSCULAR | Status: AC
  Filled 2018-11-05: qty 20

## 2018-11-05 MED ORDER — CEFTRIAXONE SODIUM 250 MG IJ SOLR
250.0000 mg | Freq: Once | INTRAMUSCULAR | Status: AC
  Administered 2018-11-05: 250 mg via INTRAMUSCULAR

## 2018-11-05 NOTE — Discharge Instructions (Signed)
Avoid all forms of sexual intercourse (oral, vaginal, anal) for the next 7 days to avoid spreading/reinfecting. Return if symptoms worsen/do not resolve, you develop fever, abdominal pain, blood in your urine, or are re-exposed to an STI.  

## 2018-11-05 NOTE — ED Provider Notes (Signed)
MRN: [id number] DOB: 03/12/2000  Subjective:   Erika Bailey is a 18 y.o. female presenting for 4 day history of vaginal itching, slight vaginal discharge. Patient has had yeast and BV in the past but feels like this is yeast infection. Patient is sexually active, does not use condoms for protection.  LMP was 08/24-26/2020, was regular. Patient is not on Depo injections anymore, last dose was 02/2018. Denies smoking cigarettes.   No current facility-administered medications for this encounter.   Current Outpatient Medications:  .  ibuprofen (ADVIL,MOTRIN) 600 MG tablet, Take 1 tablet (600 mg total) by mouth every 6 (six) hours as needed., Disp: 30 tablet, Rfl: 0 .  medroxyPROGESTERone (DEPO-PROVERA) 150 MG/ML injection, Inject 1 mL (150 mg total) into the muscle every 3 (three) months., Disp: 1 mL, Rfl: 3 .  metroNIDAZOLE (FLAGYL) 500 MG tablet, Take 1 tablet (500 mg total) by mouth 2 (two) times daily. (Patient not taking: Reported on 11/05/2018), Disp: 14 tablet, Rfl: 0   Allergies  Allergen Reactions  . Peanuts [Peanut Oil] Itching and Swelling    Past Medical History:  Diagnosis Date  . Supervision of other normal pregnancy, antepartum 12/18/2017    Nursing Staff Provider Office Location   [facility] Dating   LMP Language   English Anatomy US   Flu Vaccine   12/18/17 Genetic Screen  NIPS:   AFP:   First Screen:  Quad:   TDaP vaccine    Hgb A1C or  GTT Early  Third trimester  Rhogam     LAB RESULTS  Feeding Plan  Breast Blood Type    Contraception  Antibody   Circumcision  Rubella   Pediatrician   RPR    Support Person  HBsAg    Prenatal Classes      Past Surgical History:  Procedure Laterality Date  . DILATION AND EVACUATION N/A 01/21/2018   Procedure: DILATATION AND EVACUATION;  Surgeon: Adam PhenixArnold, James G, MD;  Location: [facility];  Service: Gynecology;  Laterality: N/A;    Review of Systems  Constitutional: Negative for chills and fever.  Respiratory: Negative for shortness of breath.    Cardiovascular: Negative for chest pain.  Gastrointestinal: Positive for abdominal pain (lower abdomen on flank sides). Negative for nausea and vomiting.  Genitourinary: Negative for dysuria, flank pain, frequency, hematuria and urgency.  Musculoskeletal: Negative for myalgias.  Skin: Negative for rash.  Neurological: Negative for dizziness and headaches.    Objective:   Vitals: BP 111/69 (BP Location: Right Arm)   Pulse 97   Temp 98.6 F (37 C) (Oral)   Resp 18   SpO2 96%   Physical Exam Constitutional:      General: She is not in acute distress.    Appearance: Normal appearance. She is well-developed and normal weight. She is not ill-appearing, toxic-appearing or diaphoretic.  HENT:     Head: Normocephalic and atraumatic.     Right Ear: External ear normal.     Left Ear: External ear normal.     Nose: Nose normal.     Mouth/Throat:     Mouth: Mucous membranes are moist.     Pharynx: Oropharynx is clear.  Eyes:     General: No scleral icterus.    Extraocular Movements: Extraocular movements intact.     Pupils: Pupils are equal, round, and reactive to light.  Cardiovascular:     Rate and Rhythm: Normal rate and regular rhythm.     Heart sounds: Normal heart sounds. No murmur. No friction rub.  No gallop.   Pulmonary:     Effort: Pulmonary effort is normal. No respiratory distress.     Breath sounds: Normal breath sounds. No stridor. No wheezing, rhonchi or rales.  Abdominal:     General: Bowel sounds are normal. There is no distension.     Palpations: Abdomen is soft. There is no mass.     Tenderness: There is no abdominal tenderness. There is no right CVA tenderness, left CVA tenderness, guarding or rebound.  Skin:    General: Skin is warm and dry.     Coloration: Skin is not pale.     Findings: No rash.  Neurological:     General: No focal deficit present.     Mental Status: She is alert and oriented to person, place, and time.  Psychiatric:        Mood and  Affect: Mood normal.        Behavior: Behavior normal.        Thought Content: Thought content normal.        Judgment: Judgment normal.     Results for orders placed or performed during the hospital encounter of 11/05/18 (from the past 24 hour(s))  POCT urinalysis dip (device)     Status: Abnormal   Collection Time: 11/05/18  6:56 PM  Result Value Ref Range   Glucose, UA NEGATIVE NEGATIVE mg/dL   Bilirubin Urine NEGATIVE NEGATIVE   Ketones, ur NEGATIVE NEGATIVE mg/dL   Specific Gravity, Urine 1.025 1.005 - 1.030   Hgb urine dipstick TRACE (A) NEGATIVE   pH 6.0 5.0 - 8.0   Protein, ur NEGATIVE NEGATIVE mg/dL   Urobilinogen, UA 0.2 0.0 - 1.0 mg/dL   Nitrite NEGATIVE NEGATIVE   Leukocytes,Ua SMALL (A) NEGATIVE  Pregnancy, urine POC     Status: None   Collection Time: 11/05/18  7:08 PM  Result Value Ref Range   Preg Test, Ur NEGATIVE NEGATIVE    Assessment and Plan :   1. Lower abdominal pain   2. Unprotected sex   3. Vaginal itching     We will treat empirically per CDC guidelines for gonorrhea and chlamydia with IM ceftriaxone and azithromycin in clinic.  Will cover for yeast vaginitis with diflucan as well. Labs pending. Counseled on safe sex practices including abstaining for 1 week following treatment. Based on PE findings and stable vitals signs, low suspicion for PID. Counseled patient on potential for adverse effects with medications prescribed/recommended today, ER and return-to-clinic precautions discussed, patient verbalized understanding.    Jaynee Eagles, [place] 11/05/18 0258

## 2018-11-05 NOTE — ED Triage Notes (Signed)
Pt sts vaginal irritation requesting STD testing; pt thinks could be yeast infection

## 2018-11-06 LAB — URINE CULTURE: Culture: NO GROWTH

## 2018-11-11 ENCOUNTER — Encounter (HOSPITAL_COMMUNITY): Payer: Self-pay

## 2018-11-11 ENCOUNTER — Telehealth (HOSPITAL_COMMUNITY): Payer: Self-pay | Admitting: Emergency Medicine

## 2018-11-11 LAB — CERVICOVAGINAL ANCILLARY ONLY
Candida vaginitis: NEGATIVE
Chlamydia: NEGATIVE
Neisseria Gonorrhea: NEGATIVE
Trichomonas: NEGATIVE

## 2018-11-11 MED ORDER — METRONIDAZOLE 500 MG PO TABS: 500.0000 mg | ORAL_TABLET | Freq: Two times a day (BID) | ORAL | 0 refills | Status: AC

## 2018-11-11 NOTE — Telephone Encounter (Signed)
Bacterial vaginosis is positive. This was not treated at the urgent care visit.  Flagyl 500 mg BID x 7 days #14 no refills sent to patients pharmacy of choice.    Attempted to reach patient. No answer at this time. Voicemail box full.

## 2018-11-12 ENCOUNTER — Telehealth (HOSPITAL_COMMUNITY): Payer: Self-pay | Admitting: Emergency Medicine

## 2018-11-12 NOTE — Telephone Encounter (Signed)
Attempted to reach patient x2. No answer at this time. Voicemail full  

## 2018-11-16 ENCOUNTER — Telehealth (HOSPITAL_COMMUNITY): Payer: Self-pay | Admitting: Emergency Medicine

## 2018-11-16 NOTE — Telephone Encounter (Signed)
Patient contacted and made aware of    results, all questions answered   

## 2019-01-07 ENCOUNTER — Encounter (HOSPITAL_COMMUNITY): Payer: Self-pay | Admitting: Emergency Medicine

## 2019-01-07 ENCOUNTER — Other Ambulatory Visit: Payer: Self-pay

## 2019-01-07 ENCOUNTER — Ambulatory Visit (HOSPITAL_COMMUNITY)
Admission: EM | Admit: 2019-01-07 | Discharge: 2019-01-07 | Disposition: A | Discharge status: Home / Self Care | Payer: BC Managed Care – PPO | Attending: Family Medicine | Admitting: Family Medicine

## 2019-01-07 DIAGNOSIS — Z202 Contact with and (suspected) exposure to infections with a predominantly sexual mode of transmission: Secondary | ICD-10-CM | POA: Diagnosis not present

## 2019-01-07 DIAGNOSIS — Z113 Encounter for screening for infections with a predominantly sexual mode of transmission: Secondary | ICD-10-CM | POA: Insufficient documentation

## 2019-01-07 NOTE — Discharge Instructions (Addendum)
We have sent testing for sexually transmitted infections. We will notify you of any positive results once they are received. If required, we will prescribe any medications you might need.  Please refrain from all sexual activity for at least the next seven days.  

## 2019-01-07 NOTE — ED Triage Notes (Signed)
PT exposed to herpes. Encountered herpes with a partner she has been involved with since August. She has never noticed a breakout on partner. Has a tingling sensation in genital area. Would like full screening.

## 2019-01-07 NOTE — ED Provider Notes (Signed)
[facility]   [id number] 01/07/19 Arrival Time: 1233  ASSESSMENT & PLAN:  1. Screening for STDs (sexually transmitted diseases)     Declines HIV/RPR testing. Declines empiric gonorrhea/chlamydia treatment.   Discharge Instructions     We have sent testing for sexually transmitted infections. We will notify you of any positive results once they are received. If required, we will prescribe any medications you might need.  Please refrain from all sexual activity for at least the next seven days.     Pending: Labs Reviewed  CERVICOVAGINAL ANCILLARY ONLY    Will notify of any positive results. Instructed to refrain from sexual activity for at least seven days.  Reviewed expectations re: course of current medical issues. Questions answered. Outlined signs and symptoms indicating need for more acute intervention. Patient verbalized understanding. After Visit Summary given.   SUBJECTIVE:  Erika Bailey is a 18 y.o. female who presents with worry over STDs. Reports sexual partner with genital herpes in the past. "Sometimes I feel a tingling sensation" around vagina. She questions having a painful vaginal rash a few weeks ago; now resolved. Desires STD testing. No vaginal discharge reported. Denies: urinary frequency, hematuria and sweats. Afebrile. No abdominal or pelvic pain. Normal PO intake wihout n/v. No rashes or lesions. Reports that she is sexually active with single female partner currently. OTC treatment: none. History of STI: none reported.  Patient's last menstrual period was 12/23/2018.  ROS: As per HPI. All other systems negative.   OBJECTIVE:  Vitals:   01/07/19 1309  BP: (!) 106/59  Pulse: 77  Resp: 16  Temp: 98.1 F (36.7 C)  TempSrc: Oral  SpO2: 100%     General appearance: alert, cooperative, appears stated age and no distress Throat: lips, mucosa, and tongue normal; teeth and gums normal CV: RRR Lungs: CTAB Back: no CVA tenderness;  FROM at waist Abdomen: soft, non-tender GU: deferred Skin: warm and dry Psychological: alert and cooperative; normal mood and affect.    Labs Reviewed  CERVICOVAGINAL ANCILLARY ONLY    Allergies  Allergen Reactions  . Peanuts [Peanut Oil] Itching and Swelling    Past Medical History:  Diagnosis Date  . Supervision of other normal pregnancy, antepartum 12/18/2017    Nursing Staff Provider Office Location   [facility] Dating   LMP Language   English Anatomy US   Flu Vaccine   12/18/17 Genetic Screen  NIPS:   AFP:   First Screen:  Quad:   TDaP vaccine    Hgb A1C or  GTT Early  Third trimester  Rhogam     LAB RESULTS  Feeding Plan  Breast Blood Type    Contraception  Antibody   Circumcision  Rubella   Pediatrician   RPR    Support Person  HBsAg    Prenatal Classes    Family History  Problem Relation Age of Onset  . Healthy Mother   . Healthy Father   . Cancer Maternal Grandmother    Social History   Socioeconomic History  . Marital status: Single    Spouse name: Not on file  . Number of children: Not on file  . Years of education: Not on file  . Highest education level: Not on file  Occupational History  . Not on file  Social Needs  . Financial resource strain: Not on file  . Food insecurity    Worry: Not on file    Inability: Not on file  . Transportation needs    Medical: Not  on file    Non-medical: Not on file  Tobacco Use  . Smoking status: Former Research scientist (life sciences)  . Smokeless tobacco: Never Used  Substance and Sexual Activity  . Alcohol use: Yes  . Drug use: Never  . Sexual activity: Yes    Birth control/protection: None  Lifestyle  . Physical activity    Days per week: Not on file    Minutes per session: Not on file  . Stress: Not on file  Relationships  . Social Herbalist on phone: Not on file    Gets together: Not on file    Attends religious service: Not on file    Active member of club or organization: Not on file    Attends meetings of clubs or  organizations: Not on file    Relationship status: Not on file  . Intimate partner violence    Fear of current or ex partner: Not on file    Emotionally abused: Not on file    Physically abused: Not on file    Forced sexual activity: Not on file  Other Topics Concern  . Not on file  Social History Narrative  . Not on file          Vanessa Kick, MD 01/07/19 1400

## 2019-01-11 LAB — CERVICOVAGINAL ANCILLARY ONLY
Bacterial vaginitis: POSITIVE — AB
Candida vaginitis: POSITIVE — AB
Chlamydia: NEGATIVE
Neisseria Gonorrhea: NEGATIVE
Trichomonas: NEGATIVE

## 2019-01-12 ENCOUNTER — Encounter (HOSPITAL_COMMUNITY): Payer: Self-pay

## 2019-01-12 ENCOUNTER — Telehealth (HOSPITAL_COMMUNITY): Payer: Self-pay | Admitting: Emergency Medicine

## 2019-01-12 MED ORDER — METRONIDAZOLE 500 MG PO TABS: 500.0000 mg | ORAL_TABLET | Freq: Two times a day (BID) | ORAL | 0 refills | Status: AC

## 2019-01-12 MED ORDER — FLUCONAZOLE 150 MG PO TABS: 150.0000 mg | ORAL_TABLET | Freq: Once | ORAL | 0 refills | Status: AC

## 2019-01-12 NOTE — Telephone Encounter (Signed)
Bacterial vaginosis is positive. This was not treated at the urgent care visit.  Flagyl 500 mg BID x 7 days #14 no refills sent to patients pharmacy of choice.    Test for candida (yeast) was positive.  Prescription for fluconazole 150mg  po now, repeat dose in 3d if needed, #2 no refills, sent to the pharmacy of record.  Recheck or followup with PCP for further evaluation if symptoms are not improving.    Attempted to reach patient. No answer at this time. Mailbox full. Mychart message sent.

## 2019-02-10 ENCOUNTER — Other Ambulatory Visit (HOSPITAL_COMMUNITY)
Admission: RE | Admit: 2019-02-10 | Discharge: 2019-02-10 | Disposition: A | Discharge status: Home / Self Care | Payer: BC Managed Care – PPO | Source: Ambulatory Visit | Attending: Obstetrics | Admitting: Obstetrics

## 2019-02-10 ENCOUNTER — Ambulatory Visit (INDEPENDENT_AMBULATORY_CARE_PROVIDER_SITE_OTHER): Payer: BC Managed Care – PPO

## 2019-02-10 ENCOUNTER — Other Ambulatory Visit: Payer: Self-pay

## 2019-02-10 VITALS — BP 106/70 | HR 69 | Ht 67.0 in | Wt 132.0 lb

## 2019-02-10 DIAGNOSIS — Z3A01 Less than 8 weeks gestation of pregnancy: Secondary | ICD-10-CM

## 2019-02-10 DIAGNOSIS — Z3201 Encounter for pregnancy test, result positive: Secondary | ICD-10-CM | POA: Diagnosis not present

## 2019-02-10 DIAGNOSIS — Z3480 Encounter for supervision of other normal pregnancy, unspecified trimester: Secondary | ICD-10-CM | POA: Insufficient documentation

## 2019-02-10 DIAGNOSIS — Z3481 Encounter for supervision of other normal pregnancy, first trimester: Secondary | ICD-10-CM

## 2019-02-10 LAB — POCT URINE PREGNANCY: Preg Test, Ur: POSITIVE — AB

## 2019-02-10 MED ORDER — BLOOD PRESSURE KIT DEVI: 1.0000 | 0 refills | Status: DC

## 2019-02-10 NOTE — Progress Notes (Addendum)
PRENATAL INTAKE SUMMARY  Ms. Echeverria presents today New OB Nurse Interview.  OB History    Gravida  2   Para      Term      Preterm      AB  1   Living  0     SAB  1   TAB      Ectopic      Multiple      Live Births             I have reviewed the patient's medical, obstetrical, social, and family histories, medications, and available lab results.  SUBJECTIVE She has no unusual complaints and complains of malodorous discharge  OBJECTIVE UPT  POSITIVE  LMP 12/23/2018  [redacted]w[redacted]d EDD 09/29/2019  New OB Intake done  GENERAL APPEARANCE: alert, well appearing   ASSESSMENT Normal pregnancy  PLAN Prenatal care at [facility] BP Cuff ordered and BabyRx Self swab done for malodorous DC

## 2019-02-12 LAB — CERVICOVAGINAL ANCILLARY ONLY
Bacterial Vaginitis (gardnerella): NEGATIVE
Candida Glabrata: NEGATIVE
Candida Vaginitis: POSITIVE — AB
Chlamydia: NEGATIVE
Comment: NEGATIVE
Comment: NEGATIVE
Comment: NEGATIVE
Comment: NEGATIVE
Comment: NEGATIVE
Comment: NORMAL
Neisseria Gonorrhea: POSITIVE — AB
Trichomonas: NEGATIVE

## 2019-02-13 ENCOUNTER — Other Ambulatory Visit: Payer: Self-pay | Admitting: Obstetrics

## 2019-02-13 DIAGNOSIS — A5400 Gonococcal infection of lower genitourinary tract, unspecified: Secondary | ICD-10-CM

## 2019-02-13 DIAGNOSIS — B373 Candidiasis of vulva and vagina: Secondary | ICD-10-CM

## 2019-02-13 DIAGNOSIS — B3731 Acute candidiasis of vulva and vagina: Secondary | ICD-10-CM

## 2019-02-13 MED ORDER — AZITHROMYCIN 500 MG PO TABS: 1000.0000 mg | ORAL_TABLET | Freq: Once | ORAL | 0 refills | Status: AC

## 2019-02-13 MED ORDER — FLUCONAZOLE 150 MG PO TABS: 150.0000 mg | ORAL_TABLET | Freq: Once | ORAL | 0 refills | Status: AC

## 2019-02-13 MED ORDER — CEFTRIAXONE SODIUM 250 MG IJ SOLR: 250.0000 mg | Freq: Once | INTRAMUSCULAR | Status: DC

## 2019-02-16 ENCOUNTER — Other Ambulatory Visit: Payer: Self-pay

## 2019-02-16 ENCOUNTER — Ambulatory Visit (HOSPITAL_COMMUNITY)
Admission: EM | Admit: 2019-02-16 | Discharge: 2019-02-16 | Disposition: A | Discharge status: Home / Self Care | Payer: BC Managed Care – PPO | Attending: Family Medicine | Admitting: Family Medicine

## 2019-02-16 ENCOUNTER — Ambulatory Visit: Payer: BC Managed Care – PPO

## 2019-02-16 DIAGNOSIS — B3731 Acute candidiasis of vulva and vagina: Secondary | ICD-10-CM

## 2019-02-16 DIAGNOSIS — A549 Gonococcal infection, unspecified: Secondary | ICD-10-CM | POA: Diagnosis not present

## 2019-02-16 DIAGNOSIS — B373 Candidiasis of vulva and vagina: Secondary | ICD-10-CM

## 2019-02-16 MED ORDER — AZITHROMYCIN 250 MG PO TABS
1000.0000 mg | ORAL_TABLET | Freq: Once | ORAL | Status: AC
  Administered 2019-02-16: 14:00:00 1000 mg via ORAL

## 2019-02-16 MED ORDER — CEFTRIAXONE SODIUM 250 MG IJ SOLR
250.0000 mg | Freq: Once | INTRAMUSCULAR | Status: AC
  Administered 2019-02-16: 14:00:00 250 mg via INTRAMUSCULAR

## 2019-02-16 MED ORDER — CEFTRIAXONE SODIUM 250 MG IJ SOLR
INTRAMUSCULAR | Status: AC
  Filled 2019-02-16: qty 250

## 2019-02-16 MED ORDER — MICONAZOLE NITRATE 200 MG VA SUPP: 200.0000 mg | Freq: Every day | VAGINAL | 0 refills | Status: DC

## 2019-02-16 MED ORDER — AZITHROMYCIN 250 MG PO TABS
ORAL_TABLET | ORAL | Status: AC
  Filled 2019-02-16: qty 4

## 2019-02-16 NOTE — ED Provider Notes (Signed)
Patient treated for gonorrhea and yeast infection based on positive results. This was done as nursing visit. Patient reporting she is now allowed to go back to her OB/GYN for treatment and requesting treatment here.   Orvan July, NP 02/16/19 1429

## 2019-02-16 NOTE — Discharge Instructions (Addendum)
We treated you for gonorrhea today based on your positive test. Suppository sent to the pharmacy to treat yeast infection

## 2019-02-16 NOTE — ED Triage Notes (Signed)
Per orders by provider Loura Halt, pt is being treated for gonorrhea and yeast infection based on test results.

## 2019-02-17 ENCOUNTER — Other Ambulatory Visit: Payer: Self-pay

## 2019-02-17 DIAGNOSIS — Z20822 Contact with and (suspected) exposure to covid-19: Secondary | ICD-10-CM

## 2019-02-18 LAB — NOVEL CORONAVIRUS, NAA: SARS-CoV-2, NAA: NOT DETECTED

## 2019-03-12 NOTE — L&D Delivery Note (Addendum)
OB/GYN Faculty Practice Delivery Note  Erika Bailey is a 19 y.o. G2P0010 s/p NSVD at [redacted]w[redacted]d. She was admitted for IOL on 10/06/19 for post-dates.  ROM: 5h 41m with  fluid GBS Status:  Positive/-- (07/20 0325) Maximum Maternal Temperature: 98.54F  Labor Progress: . Initial SVE: 1.5/thick/-2. Cytotec and foley bulb were placed on arrival. Pitocin was then started at 1800 on 7/28. SROM for clear fluid. She then progressed to complete with uncomplicated delivery as noted below.  Delivery Date/Time:  Delivery: Called to room and patient was complete and pushing. Head delivered without complication. No nuchal cord was not present. Shoulder and body delivered in usual fashion. Infant with spontaneous cry, placed on mother's abdomen, dried and stimulated. Cord clamped x 2 after 1-minute delay, and cut by baby's father. Cord blood drawn. Placenta delivered spontaneously with gentle cord traction. Fundus firm with massage and Pitocin. Labia, perineum, vagina, and cervix inspected without evidence of laceration.   Baby Weight: pending  Placenta: 3-vessel cord, intact, NOT sent for path Complications: None Lacerations: None EBL: 175 mL Analgesia: Epidural  Infant:  APGAR (1 MIN): 9   APGAR (5 MINS): 9   APGAR (10 MINS):     Lynnda Shields, MD [facility] Family Medicine Fellow, [facility] for [facility], [facility] 10/07/2019, 9:40 AM

## 2019-03-15 ENCOUNTER — Other Ambulatory Visit: Payer: Self-pay

## 2019-03-15 ENCOUNTER — Ambulatory Visit (INDEPENDENT_AMBULATORY_CARE_PROVIDER_SITE_OTHER): Payer: BC Managed Care – PPO | Admitting: Advanced Practice Midwife

## 2019-03-15 ENCOUNTER — Encounter: Payer: Self-pay | Admitting: Advanced Practice Midwife

## 2019-03-15 DIAGNOSIS — Z3A11 11 weeks gestation of pregnancy: Secondary | ICD-10-CM | POA: Diagnosis not present

## 2019-03-15 DIAGNOSIS — O98211 Gonorrhea complicating pregnancy, first trimester: Secondary | ICD-10-CM | POA: Diagnosis not present

## 2019-03-15 DIAGNOSIS — R519 Headache, unspecified: Secondary | ICD-10-CM | POA: Insufficient documentation

## 2019-03-15 DIAGNOSIS — A5424 Gonococcal female pelvic inflammatory disease: Secondary | ICD-10-CM | POA: Diagnosis not present

## 2019-03-15 DIAGNOSIS — O26891 Other specified pregnancy related conditions, first trimester: Secondary | ICD-10-CM | POA: Insufficient documentation

## 2019-03-15 DIAGNOSIS — Z3481 Encounter for supervision of other normal pregnancy, first trimester: Secondary | ICD-10-CM

## 2019-03-15 DIAGNOSIS — Z3480 Encounter for supervision of other normal pregnancy, unspecified trimester: Secondary | ICD-10-CM

## 2019-03-15 MED ORDER — CEFTRIAXONE SODIUM 250 MG IJ SOLR
250.0000 mg | Freq: Once | INTRAMUSCULAR | Status: AC
  Administered 2019-03-15: 14:00:00 250 mg via INTRAMUSCULAR

## 2019-03-15 MED ORDER — AZITHROMYCIN 500 MG PO TABS
1000.0000 mg | ORAL_TABLET | Freq: Once | ORAL | Status: AC
  Administered 2019-03-15: 14:00:00 1000 mg via ORAL

## 2019-03-15 NOTE — Progress Notes (Deleted)
Subjective:   Erika Bailey is a 19 y.o. G2P0010 at 100w5dby {Ob dating:14516} being seen today for her first obstetrical visit.  Her obstetrical history is significant for {ob risk factors:10154} and has Incomplete abortion; Encounter for prescription for depo-Provera; and Supervision of other normal pregnancy, antepartum on their problem list.. Patient {does/does not:19097} intend to breast feed. Pregnancy history fully reviewed.  Patient reports {sx:14538}.  HISTORY: OB History  Gravida Para Term Preterm AB Living  2 0 0 0 1 0  SAB TAB Ectopic Multiple Live Births  1 0 0 0 0    # Outcome Date GA Lbr Len/2nd Weight Sex Delivery Anes PTL Lv  2 Current           1 SAB            Past Medical History:  Diagnosis Date  . Supervision of other normal pregnancy, antepartum 12/18/2017    Nursing Staff Provider Office Location   [facility] Dating   LMP Language   English Anatomy [place]  Flu Vaccine   12/18/17 Genetic Screen  NIPS:   AFP:   First Screen:  Quad:   TDaP vaccine    Hgb A1C or  GTT Early  Third trimester  Rhogam     LAB RESULTS  Feeding Plan  Breast Blood Type    Contraception  Antibody   Circumcision  Rubella   Pediatrician   RPR    Support Person  HBsAg    Prenatal Classes    Past Surgical History:  Procedure Laterality Date  . DILATION AND EVACUATION N/A 01/21/2018   Procedure: DILATATION AND EVACUATION;  Surgeon: AWoodroe Mode MD;  [facility];  Service: Gynecology;  Laterality: N/A;   Family History  Problem Relation Age of Onset  . Healthy Mother   . Healthy Father   . Cancer Maternal Grandmother    Social History   Tobacco Use  . Smoking status: Former Smoker    Quit date: 01/24/2019    Years since quitting: 0.1  . Smokeless tobacco: Never Used  Substance Use Topics  . Alcohol use: Yes  . Drug use: Never   Allergies  Allergen Reactions  . Peanuts [Peanut Oil] Itching and Swelling   Current Outpatient Medications on File Prior to Visit    Medication Sig Dispense Refill  . Blood Pressure Monitoring (BLOOD PRESSURE KIT) DEVI 1 kit by Does not apply route once a week. Check BP regularly and record reading into the Babyscripts App. Large Cuff. DX O90.0 1 each 0  . miconazole (MICOTIN) 200 MG vaginal suppository Place 1 suppository (200 mg total) vaginally at bedtime. 3 suppository 0  . [DISCONTINUED] medroxyPROGESTERone (DEPO-PROVERA) 150 MG/ML injection Inject 1 mL (150 mg total) into the muscle every 3 (three) months. 1 mL 3   Current Facility-Administered Medications on File Prior to Visit  Medication Dose Route Frequency Provider Last Rate Last Admin  . cefTRIAXone (ROCEPHIN) injection 250 mg  250 mg Intramuscular Once HShelly Bombard MD        *** Indications for ASA therapy (per uptodate) One of the following: Previous pregnancy with preeclampsia, especially early onset and with an adverse outcome {yes/no:20286} Multifetal gestation {yes/no:20286} Chronic hypertension {yes/no:20286} Type 1 or 2 diabetes mellitus {yes/no:20286} Chronic kidney disease {yes/no:20286} Autoimmune disease (antiphospholipid syndrome, systemic lupus erythematosus) {yes/no:20286}  *** Two or more of the following: Nulliparity {yes/no:20286} Obesity (body mass index >30 kg/m2) {yes/no:20286} Family history of preeclampsia in mother or sister {  yes/no:20286} Age ?35 years {yes/no:20286} Sociodemographic characteristics (African American race, low socioeconomic level) {yes/no:20286} Personal risk factors (eg, previous pregnancy with low birth weight or small for gestational age infant, previous adverse pregnancy outcome [eg, stillbirth], interval >10 years between pregnancies) {yes/no:20286}  *** Indications for early 1 hour GTT (per uptodate)  BMI >25 (>23 in Asian women) AND one of the following  Gestational diabetes mellitus in a previous pregnancy {yes/no:20286} Glycated hemoglobin ?5.7 percent (39 mmol/mol), impaired glucose tolerance,  or impaired fasting glucose on previous testing {yes/no:20286} First-degree relative with diabetes {yes/no:20286} High-risk race/ethnicity (eg, African American, Latino, Native American, [place] American, [place] Islander) {yes/no:20286} History of cardiovascular disease {yes/no:20286} Hypertension or on therapy for hypertension {yes/no:20286} High-density lipoprotein cholesterol level <35 mg/dL (0.90 mmol/L) and/or a triglyceride level >250 mg/dL (2.82 mmol/L) {yes/no:20286} Polycystic ovary syndrome {yes/no:20286} Physical inactivity {yes/no:20286} Other clinical condition associated with insulin resistance (eg, severe obesity, acanthosis nigricans) {yes/no:20286} Previous birth of an infant weighing ?4000 g {yes/no:20286} Previous stillbirth of unknown cause {yes/no:20286} Exam   There were no vitals filed for this visit.    Uterus:     Pelvic Exam: Perineum: no hemorrhoids, normal perineum   Vulva: normal external genitalia, no lesions   Vagina:  normal mucosa, normal discharge   Cervix: no lesions and normal, pap smear done.    Adnexa: normal adnexa and no mass, fullness, tenderness   Bony Pelvis: average  System: General: well-developed, well-nourished female in no acute distress   Breast:  normal appearance, no masses or tenderness   Skin: normal coloration and turgor, no rashes   Neurologic: oriented, normal, negative, normal mood   Extremities: normal strength, tone, and muscle mass, ROM of all joints is normal   HEENT PERRLA, extraocular movement intact and sclera clear, anicteric   Mouth/Teeth mucous membranes moist, pharynx normal without lesions and dental hygiene good   Neck supple and no masses   Cardiovascular: regular rate and rhythm   Respiratory:  no respiratory distress, normal breath sounds   Abdomen: soft, non-tender; bowel sounds normal; no masses,  no organomegaly     Assessment:   Pregnancy: G2P0010 Patient Active Problem List   Diagnosis Date Noted  .  Supervision of other normal pregnancy, antepartum 02/10/2019  . Encounter for prescription for depo-Provera 02/10/2018  . Incomplete abortion 01/21/2018     Plan:  1. Supervision of other normal pregnancy, antepartum ***  2. Gonorrhea affecting pregnancy in first trimester -- Tx 12/8.  TOC today    Initial labs drawn. Continue prenatal vitamins. Discussed and offered genetic screening options, including Quad screen/AFP, NIPS testing, and option to decline testing. Benefits/risks/alternatives reviewed. Pt aware that anatomy US is form of genetic screening with lower accuracy in detecting trisomies than blood work.  Pt chooses/declines genetic screening today. {Blank multiple:19196::"First trimester screen","Quad screen","NIPS"}: {requests/ordered/declines:14581}. Ultrasound discussed; fetal anatomic survey: {requests/ordered/declines:14581}. Problem list reviewed and updated. The nature of [place] with multiple MDs and other Advanced Practice Providers was explained to patient; also emphasized that residents, students are part of our team. Routine obstetric precautions reviewed. No follow-ups on file.   Fatima Blank, CNM 03/15/19 8:28 AM

## 2019-03-15 NOTE — Progress Notes (Signed)
Subjective:   Erika Bailey is a 19 y.o. G2P0010 at 33w5dby LMP being seen today for her first obstetrical visit.  Her obstetrical history is significant for spontaneous abortion and has Supervision of other normal pregnancy, antepartum and Gonorrhea affecting pregnancy in first trimester on their problem list.. Patient does intend to breast feed. Pregnancy history fully reviewed.  Patient was recently treated 12/8 in the ED for gonorrhea infection but is unsure of her partner's treatment. She reports having intercourse with her partner since then and would like to be treated again.   She also reports headaches, for which she has not tried anything for alleviation. Patient was encouraged to stay hydrated by drinking a lot of water and can use tylenol PRN for relief. She denies nausea/vomiting, stating that it has improved. She has no other complaints.    HISTORY: OB History  Gravida Para Term Preterm AB Living  2 0 0 0 1 0  SAB TAB Ectopic Multiple Live Births  1 0 0 0 0    # Outcome Date GA Lbr Len/2nd Weight Sex Delivery Anes PTL Lv  2 Current           1 SAB            Past Medical History:  Diagnosis Date  . Supervision of other normal pregnancy, antepartum 12/18/2017    Nursing Staff Provider Office Location   [facility] Dating   LMP Language   English Anatomy [place]  Flu Vaccine   12/18/17 Genetic Screen  NIPS:   AFP:   First Screen:  Quad:   TDaP vaccine    Hgb A1C or  GTT Early  Third trimester  Rhogam     LAB RESULTS  Feeding Plan  Breast Blood Type    Contraception  Antibody   Circumcision  Rubella   Pediatrician   RPR    Support Person  HBsAg    Prenatal Classes    Past Surgical History:  Procedure Laterality Date  . DILATION AND EVACUATION N/A 01/21/2018   Procedure: DILATATION AND EVACUATION;  Surgeon: AWoodroe Mode MD;  [facility];  Service: Gynecology;  Laterality: N/A;   Family History  Problem Relation Age of Onset  . Healthy Mother   . Healthy Father    . Cancer Maternal Grandmother    Patient reports family history of breast cancer - grandmother was 759y/o. She reports no other known risk factors.   Social History   Tobacco Use  . Smoking status: Former Smoker    Quit date: 01/24/2019    Years since quitting: 0.1  . Smokeless tobacco: Never Used  Substance Use Topics  . Alcohol use: Not Currently  . Drug use: Never   Allergies  Allergen Reactions  . Peanuts [Peanut Oil] Itching and Swelling   Current Outpatient Medications on File Prior to Visit  Medication Sig Dispense Refill  . Blood Pressure Monitoring (BLOOD PRESSURE KIT) DEVI 1 kit by Does not apply route once a week. Check BP regularly and record reading into the Babyscripts App. Large Cuff. DX O90.0 1 each 0  . miconazole (MICOTIN) 200 MG vaginal suppository Place 1 suppository (200 mg total) vaginally at bedtime. 3 suppository 0  . [DISCONTINUED] medroxyPROGESTERone (DEPO-PROVERA) 150 MG/ML injection Inject 1 mL (150 mg total) into the muscle every 3 (three) months. 1 mL 3   Current Facility-Administered Medications on File Prior to Visit  Medication Dose Route Frequency Provider Last Rate Last Admin  .  cefTRIAXone (ROCEPHIN) injection 250 mg  250 mg Intramuscular Once Shelly Bombard, MD         Indications for ASA therapy (per uptodate) One of the following: Previous pregnancy with preeclampsia, especially early onset and with an adverse outcome No Multifetal gestation No Chronic hypertension No Type 1 or 2 diabetes mellitus No Chronic kidney disease No Autoimmune disease (antiphospholipid syndrome, systemic lupus erythematosus) No    Two or more of the following: Nulliparity Yes Obesity (body mass index >30 kg/m2) No Family history of preeclampsia in mother or sister No Age ?35 years No Sociodemographic characteristics (African American race, low socioeconomic level) Yes Personal risk factors (eg, previous pregnancy with low birth weight or small for  gestational age infant, previous adverse pregnancy outcome [eg, stillbirth], interval >10 years between pregnancies) No   Indications for early 1 hour GTT (per uptodate)  BMI >25 (>23 in Asian women) AND one of the following  Gestational diabetes mellitus in a previous pregnancy No Glycated hemoglobin ?5.7 percent (39 mmol/mol), impaired glucose tolerance, or impaired fasting glucose on previous testing No First-degree relative with diabetes No High-risk race/ethnicity (eg, African American, Latino, Native American, [place] American, Pacific Islander) Yes History of cardiovascular disease No Hypertension or on therapy for hypertension No High-density lipoprotein cholesterol level <35 mg/dL (0.90 mmol/L) and/or a triglyceride level >250 mg/dL (2.82 mmol/L) No Polycystic ovary syndrome No Physical inactivity No Other clinical condition associated with insulin resistance (eg, severe obesity, acanthosis nigricans) No Previous birth of an infant weighing ?4000 g No Previous stillbirth of unknown cause No Exam   There were no vitals filed for this visit.   VS reviewed today and were wnl    Physical Exam LMP 12/23/2018 (Exact Date)   Physical Exam  Constitutional: She is alert, well-appearing, and oriented 19 y/o female that appears to be in no acute distress.   Head: Normocephalic and atraumatic.  Eyes: Conjunctivae and EOM are normal.  Respiratory: Effort normal and nonlabored.  Cardiovascular: Normal rate and rhythm.  Skin - normal coloration and turgor, no rashes, no suspicious skin lesions noted    Assessment:   Pregnancy: G2P0010 Patient Active Problem List   Diagnosis Date Noted  . Gonorrhea affecting pregnancy in first trimester 03/15/2019  . Supervision of other normal pregnancy, antepartum 02/10/2019     Plan:  1. Supervision of other normal pregnancy, antepartum  - Babyscripts Schedule Optimization - Urine Culture - HgB A1c - Genetic Screening - Obstetric Panel,  Including HIV  2. Gonorrhea affecting pregnancy in first trimester  - cefTRIAXone (ROCEPHIN) injection 250 mg - azithromycin (ZITHROMAX) tablet 1,000 mg  Script written to treat partner for Gonorrhea. Will repeat testing at follow up appointment to confirm clearance of infection.   3.  Headache in pregnancy --Daily headache, has not tried any treatments --Increase PO fluids, food intake. Try Tylenol, rest in dark room. --Let us know if symptoms persist with these treatments.    Initial labs drawn. Continue prenatal vitamins. Discussed and offered genetic screening options, including Quad screen/AFP, NIPS testing, and option to decline testing. Benefits/risks/alternatives reviewed. Pt aware that anatomy US is form of genetic screening with lower accuracy in detecting trisomies than blood work.  Pt chooses genetic screening today. NIPS: ordered. Ultrasound discussed; fetal anatomic survey: requested. Problem list reviewed and updated. The nature of [facility] with multiple MDs and other Advanced Practice Providers was explained to patient; also emphasized that residents, students are part of our team. Routine  obstetric precautions reviewed.  Follow up in 4 weeks for ROB, repeat GC/CT testing.   Fatima Blank, CNM 03/15/19 5:06 PM   I confirm that I have verified the information documented in the physician assistant student's note and that I have also personally reperformed the history, physical exam and all medical decision making activities of this service and have verified that all service and findings are accurately documented in this student's note.    Elvera Maria, CNM 03/15/2019 5:11 PM

## 2019-03-15 NOTE — Patient Instructions (Signed)
First Trimester of Pregnancy The first trimester of pregnancy is from week 1 until the end of week 13 (months 1 through 3). A week after a sperm fertilizes an egg, the egg will implant on the wall of the uterus. This embryo will begin to develop into a baby. Genes from you and your partner will form the baby. The female genes will determine whether the baby will be a boy or a girl. At 6-8 weeks, the eyes and face will be formed, and the heartbeat can be seen on ultrasound. At the end of 12 weeks, all the baby's organs will be formed. Now that you are pregnant, you will want to do everything you can to have a healthy baby. Two of the most important things are to get good prenatal care and to follow your health care provider's instructions. Prenatal care is all the medical care you receive before the baby's birth. This care will help prevent, find, and treat any problems during the pregnancy and childbirth. Body changes during your first trimester Your body goes through many changes during pregnancy. The changes vary from woman to woman.  You may gain or lose a couple of pounds at first.  You may feel sick to your stomach (nauseous) and you may throw up (vomit). If the vomiting is uncontrollable, call your health care provider.  You may tire easily.  You may develop headaches that can be relieved by medicines. All medicines should be approved by your health care provider.  You may urinate more often. Painful urination may mean you have a bladder infection.  You may develop heartburn as a result of your pregnancy.  You may develop constipation because certain hormones are causing the muscles that push stool through your intestines to slow down.  You may develop hemorrhoids or swollen veins (varicose veins).  Your breasts may begin to grow larger and become tender. Your nipples may stick out more, and the tissue that surrounds them (areola) may become darker.  Your gums may bleed and may be  sensitive to brushing and flossing.  Dark spots or blotches (chloasma, mask of pregnancy) may develop on your face. This will likely fade after the baby is born.  Your menstrual periods will stop.  You may have a loss of appetite.  You may develop cravings for certain kinds of food.  You may have changes in your emotions from day to day, such as being excited to be pregnant or being concerned that something may go wrong with the pregnancy and baby.  You may have more vivid and strange dreams.  You may have changes in your hair. These can include thickening of your hair, rapid growth, and changes in texture. Some women also have hair loss during or after pregnancy, or hair that feels dry or thin. Your hair will most likely return to normal after your baby is born. What to expect at prenatal visits During a routine prenatal visit:  You will be weighed to make sure you and the baby are growing normally.  Your blood pressure will be taken.  Your abdomen will be measured to track your baby's growth.  The fetal heartbeat will be listened to between weeks 10 and 14 of your pregnancy.  Test results from any previous visits will be discussed. Your health care provider may ask you:  How you are feeling.  If you are feeling the baby move.  If you have had any abnormal symptoms, such as leaking fluid, bleeding, severe headaches, or abdominal   cramping.  If you are using any tobacco products, including cigarettes, chewing tobacco, and electronic cigarettes.  If you have any questions. Other tests that may be performed during your first trimester include:  Blood tests to find your blood type and to check for the presence of any previous infections. The tests will also be used to check for low iron levels (anemia) and protein on red blood cells (Rh antibodies). Depending on your risk factors, or if you previously had diabetes during pregnancy, you may have tests to check for high blood sugar  that affects pregnant women (gestational diabetes).  Urine tests to check for infections, diabetes, or protein in the urine.  An ultrasound to confirm the proper growth and development of the baby.  Fetal screens for spinal cord problems (spina bifida) and Down syndrome.  HIV (human immunodeficiency virus) testing. Routine prenatal testing includes screening for HIV, unless you choose not to have this test.  You may need other tests to make sure you and the baby are doing well. Follow these instructions at home: Medicines  Follow your health care provider's instructions regarding medicine use. Specific medicines may be either safe or unsafe to take during pregnancy.  Take a prenatal vitamin that contains at least 600 micrograms (mcg) of folic acid.  If you develop constipation, try taking a stool softener if your health care provider approves. Eating and drinking   Eat a balanced diet that includes fresh fruits and vegetables, whole grains, good sources of protein such as meat, eggs, or tofu, and low-fat dairy. Your health care provider will help you determine the amount of weight gain that is right for you.  Avoid raw meat and uncooked cheese. These carry germs that can cause birth defects in the baby.  Eating four or five small meals rather than three large meals a day may help relieve nausea and vomiting. If you start to feel nauseous, eating a few soda crackers can be helpful. Drinking liquids between meals, instead of during meals, also seems to help ease nausea and vomiting.  Limit foods that are high in fat and processed sugars, such as fried and sweet foods.  To prevent constipation: ? Eat foods that are high in fiber, such as fresh fruits and vegetables, whole grains, and beans. ? Drink enough fluid to keep your urine clear or pale yellow. Activity  Exercise only as directed by your health care provider. Most women can continue their usual exercise routine during  pregnancy. Try to exercise for 30 minutes at least 5 days a week. Exercising will help you: ? Control your weight. ? Stay in shape. ? Be prepared for labor and delivery.  Experiencing pain or cramping in the lower abdomen or lower back is a good sign that you should stop exercising. Check with your health care provider before continuing with normal exercises.  Try to avoid standing for long periods of time. Move your legs often if you must stand in one place for a long time.  Avoid heavy lifting.  Wear low-heeled shoes and practice good posture.  You may continue to have sex unless your health care provider tells you not to. Relieving pain and discomfort  Wear a good support bra to relieve breast tenderness.  Take warm sitz baths to soothe any pain or discomfort caused by hemorrhoids. Use hemorrhoid cream if your health care provider approves.  Rest with your legs elevated if you have leg cramps or low back pain.  If you develop varicose veins in   your legs, wear support hose. Elevate your feet for 15 minutes, 3-4 times a day. Limit salt in your diet. Prenatal care  Schedule your prenatal visits by the twelfth week of pregnancy. They are usually scheduled monthly at first, then more often in the last 2 months before delivery.  Write down your questions. Take them to your prenatal visits.  Keep all your prenatal visits as told by your health care provider. This is important. Safety  Wear your seat belt at all times when driving.  Make a list of emergency phone numbers, including numbers for family, friends, the hospital, and police and fire departments. General instructions  Ask your health care provider for a referral to a local prenatal education class. Begin classes no later than the beginning of month 6 of your pregnancy.  Ask for help if you have counseling or nutritional needs during pregnancy. Your health care provider can offer advice or refer you to specialists for help  with various needs.  Do not use hot tubs, steam rooms, or saunas.  Do not douche or use tampons or scented sanitary pads.  Do not cross your legs for long periods of time.  Avoid cat litter boxes and soil used by cats. These carry germs that can cause birth defects in the baby and possibly loss of the fetus by miscarriage or stillbirth.  Avoid all smoking, herbs, alcohol, and medicines not prescribed by your health care provider. Chemicals in these products affect the formation and growth of the baby.  Do not use any products that contain nicotine or tobacco, such as cigarettes and e-cigarettes. If you need help quitting, ask your health care provider. You may receive counseling support and other resources to help you quit.  Schedule a dentist appointment. At home, brush your teeth with a soft toothbrush and be gentle when you floss. Contact a health care provider if:  You have dizziness.  You have mild pelvic cramps, pelvic pressure, or nagging pain in the abdominal area.  You have persistent nausea, vomiting, or diarrhea.  You have a bad smelling vaginal discharge.  You have pain when you urinate.  You notice increased swelling in your face, hands, legs, or ankles.  You are exposed to fifth disease or chickenpox.  You are exposed to German measles (rubella) and have never had it. Get help right away if:  You have a fever.  You are leaking fluid from your vagina.  You have spotting or bleeding from your vagina.  You have severe abdominal cramping or pain.  You have rapid weight gain or loss.  You vomit blood or material that looks like coffee grounds.  You develop a severe headache.  You have shortness of breath.  You have any kind of trauma, such as from a fall or a car accident. Summary  The first trimester of pregnancy is from week 1 until the end of week 13 (months 1 through 3).  Your body goes through many changes during pregnancy. The changes vary from  woman to woman.  You will have routine prenatal visits. During those visits, your health care provider will examine you, discuss any test results you may have, and talk with you about how you are feeling. This information is not intended to replace advice given to you by your health care provider. Make sure you discuss any questions you have with your health care provider. Document Revised: 02/07/2017 Document Reviewed: 02/07/2016 Elsevier Patient Education  2020 [employer]  

## 2019-03-15 NOTE — Progress Notes (Deleted)
History:  Erika Bailey is a 19 y.o. G2P0010 who presents to clinic today for new OB care.    Patient was recently treated in December in the ED for gonorrhea infection but is unsure of her partner's treatment. She reports having intercourse with her partner since then and would like to be treated again.   She also reports some headaches, for which she has not tried anything for alleviation. Patient was encouraged to stay hydrated by drinking a lot of water and can use tylenol PRN for relief. She denies nausea/vomiting, stating that it has improved. She has no other complaints.    The following portions of the patient's history were reviewed and updated as appropriate: allergies, current medications, family history, past medical history, social history, past surgical history and problem list.  Review of Systems:  Review of Systems  Gastrointestinal: Negative for nausea and vomiting (patient reports has improved).  Neurological: Positive for headaches.      Objective:  Physical Exam LMP 12/23/2018 (Exact Date)  Physical Exam  Constitutional: She is oriented to person, place, and time. She appears well-developed.  HENT:  Head: Normocephalic and atraumatic.  Eyes: Conjunctivae and EOM are normal.  Respiratory: Effort normal.  Neurological: She is alert and oriented to person, place, and time.      Labs and Imaging No results found for this or any previous visit (from the past 24 hour(s)).  No results found.   Assessment & Plan:  1. Supervision of other normal pregnancy, antepartum *** - Babyscripts Schedule Optimization - Urine Culture - HgB A1c - Genetic Screening - Obstetric Panel, Including HIV  2. Gonorrhea affecting pregnancy in first trimester *** - cefTRIAXone (ROCEPHIN) injection 250 mg - azithromycin (ZITHROMAX) tablet 1,000 mg  Script written to treat partner for Gonorrhea.  Discussed patient's treatment course with the practice and what to expect in  the next few months.  Patient agreed   Erika Bailey 03/15/2019 2:41 PM

## 2019-03-16 LAB — OBSTETRIC PANEL, INCLUDING HIV
Antibody Screen: NEGATIVE
Basophils Absolute: 0 10*3/uL (ref 0.0–0.2)
Basos: 1 %
EOS (ABSOLUTE): 0.1 10*3/uL (ref 0.0–0.4)
Eos: 2 %
HIV Screen 4th Generation wRfx: NONREACTIVE
Hematocrit: 38 % (ref 34.0–46.6)
Hemoglobin: 12.6 g/dL (ref 11.1–15.9)
Hepatitis B Surface Ag: NEGATIVE
Immature Grans (Abs): 0 10*3/uL (ref 0.0–0.1)
Immature Granulocytes: 0 %
Lymphocytes Absolute: 1.2 10*3/uL (ref 0.7–3.1)
Lymphs: 18 %
MCH: 27.8 pg (ref 26.6–33.0)
MCHC: 33.2 g/dL (ref 31.5–35.7)
MCV: 84 fL (ref 79–97)
Monocytes Absolute: 0.5 10*3/uL (ref 0.1–0.9)
Monocytes: 8 %
Neutrophils Absolute: 4.7 10*3/uL (ref 1.4–7.0)
Neutrophils: 71 %
Platelets: 314 10*3/uL (ref 150–450)
RBC: 4.53 x10E6/uL (ref 3.77–5.28)
RDW: 16 % — ABNORMAL HIGH (ref 11.7–15.4)
RPR Ser Ql: NONREACTIVE
Rh Factor: POSITIVE
Rubella Antibodies, IGG: 11.8 index (ref >0.99)
WBC: 6.6 10*3/uL (ref 3.4–10.8)

## 2019-03-16 LAB — HEMOGLOBIN A1C
Est. average glucose Bld gHb Est-mCnc: 103 mg/dL
Hgb A1c MFr Bld: 5.2 % (ref 4.8–5.6)

## 2019-03-19 LAB — URINE CULTURE

## 2019-03-23 ENCOUNTER — Telehealth: Payer: Self-pay | Admitting: Advanced Practice Midwife

## 2019-03-23 NOTE — Telephone Encounter (Signed)
Called pt to follow up on her headaches reported at new OB visit on 03/15/19 and to discuss starting baby ASA in this pregnancy.  Pt nullip, african american as risk factors for preeclampsia.  Pt mailbox is full.

## 2019-04-01 ENCOUNTER — Encounter (HOSPITAL_COMMUNITY): Payer: Self-pay | Admitting: Emergency Medicine

## 2019-04-01 ENCOUNTER — Ambulatory Visit (HOSPITAL_COMMUNITY)
Admission: EM | Admit: 2019-04-01 | Discharge: 2019-04-01 | Disposition: A | Discharge status: Home / Self Care | Payer: BC Managed Care – PPO | Attending: Family Medicine | Admitting: Family Medicine

## 2019-04-01 ENCOUNTER — Other Ambulatory Visit: Payer: Self-pay

## 2019-04-01 DIAGNOSIS — Z20822 Contact with and (suspected) exposure to covid-19: Secondary | ICD-10-CM | POA: Insufficient documentation

## 2019-04-01 DIAGNOSIS — Z3A00 Weeks of gestation of pregnancy not specified: Secondary | ICD-10-CM | POA: Insufficient documentation

## 2019-04-01 DIAGNOSIS — N898 Other specified noninflammatory disorders of vagina: Secondary | ICD-10-CM | POA: Insufficient documentation

## 2019-04-01 DIAGNOSIS — Z87891 Personal history of nicotine dependence: Secondary | ICD-10-CM | POA: Insufficient documentation

## 2019-04-01 DIAGNOSIS — O26899 Other specified pregnancy related conditions, unspecified trimester: Secondary | ICD-10-CM | POA: Diagnosis not present

## 2019-04-01 DIAGNOSIS — Z8619 Personal history of other infectious and parasitic diseases: Secondary | ICD-10-CM

## 2019-04-01 DIAGNOSIS — Z0189 Encounter for other specified special examinations: Secondary | ICD-10-CM

## 2019-04-01 LAB — OB RESULTS CONSOLE GC/CHLAMYDIA: Gonorrhea: NEGATIVE

## 2019-04-01 NOTE — ED Provider Notes (Signed)
[facility]   [id number] 04/01/19 Arrival Time: 1453  ASSESSMENT & PLAN:  1. Patient request for diagnostic testing   2. Vaginal discharge     Declines empiric gonorrhea/chlamydia treatment. Prefers to wait for cytology results. COVID testing sent at her request. See letter in chart for isolation guidelines.   Discharge Instructions     We have sent testing for sexually transmitted infections. We will notify you of any positive results once they are received. If required, we will prescribe any medications you might need.  Please refrain from all sexual activity for at least the next seven days.  You have been tested for COVID-19 today. If your test returns positive, you will receive a phone call from [facility] regarding your results. Negative test results are not called. Both positive and negative results area always visible on MyChart. If you do not have a MyChart account, sign up instructions are provided in your discharge papers. Please do not hesitate to contact us should you have questions or concerns.     Pending: Labs Reviewed  NOVEL CORONAVIRUS, NAA (HOSP ORDER, SEND-OUT TO REF LAB; TAT 18-24 HRS)  CERVICOVAGINAL ANCILLARY ONLY    Will notify of any positive results. Instructed to refrain from sexual activity for at least seven days.  Reviewed expectations re: course of current medical issues. Questions answered. Outlined signs and symptoms indicating need for more acute intervention. Patient verbalized understanding. After Visit Summary given.   SUBJECTIVE:  Erika Bailey is a 19 y.o. female who presents with complaint of vaginal discharge. Onset gradual. First noticed a week ago. Describes discharge as thick and green and yellow; without odor. No specific aggravating or alleviating factors reported. Denies: urinary frequency, dysuria and gross hematuria. Afebrile. No abdominal or pelvic pain. Normal PO intake wihout n/v. No genital rashes or  lesions. Reports that she is sexually active. OTC treatment: none. Chart review shows h/o treated gonorrhea 02/16/2019.  Patient's last menstrual period was 12/23/2018 (exact date). Patient reports she is pregnant.   OBJECTIVE:  Vitals:   04/01/19 1610  BP: (!) 100/58  Pulse: 84  Resp: 12  Temp: 99.4 F (37.4 C)  TempSrc: Oral  SpO2: 100%     General appearance: alert, cooperative, appears stated age and no distress Throat: lips, mucosa, and tongue normal; teeth and gums normal CV: RRR Lungs: CTAB Back: no CVA tenderness; FROM at waist Abdomen: soft, non-tender GU: deferred Skin: warm and dry Psychological: alert and cooperative; normal mood and affect.    Labs Reviewed  NOVEL CORONAVIRUS, NAA (HOSP ORDER, SEND-OUT TO REF LAB; TAT 18-24 HRS)  CERVICOVAGINAL ANCILLARY ONLY    Allergies  Allergen Reactions  . Peanuts [Peanut Oil] Itching and Swelling    Past Medical History:  Diagnosis Date  . Supervision of other normal pregnancy, antepartum 12/18/2017    Nursing Staff Provider Office Location   [facility] Dating   LMP Language   English Anatomy US   Flu Vaccine   12/18/17 Genetic Screen  NIPS:   AFP:   First Screen:  Quad:   TDaP vaccine    Hgb A1C or  GTT Early  Third trimester  Rhogam     LAB RESULTS  Feeding Plan  Breast Blood Type    Contraception  Antibody   Circumcision  Rubella   Pediatrician   RPR    Support Person  HBsAg    Prenatal Classes    Family History  Problem Relation Age of Onset  . Healthy Mother   .  Healthy Father   . Cancer Maternal Grandmother    Social History   Socioeconomic History  . Marital status: Single    Spouse name: Not on file  . Number of children: Not on file  . Years of education: Not on file  . Highest education level: Not on file  Occupational History  . Occupation: [employer]  Tobacco Use  . Smoking status: Former Smoker    Quit date: 01/24/2019    Years since quitting: 0.1  . Smokeless tobacco: Never Used  Substance  and Sexual Activity  . Alcohol use: Not Currently  . Drug use: Never  . Sexual activity: Yes    Birth control/protection: None  Other Topics Concern  . Not on file  Social History Narrative  . Not on file   Social Determinants of Health   Financial Resource Strain:   . Difficulty of Paying Living Expenses: Not on file  Food Insecurity:   . Worried About Charity fundraiser in the Last Year: Not on file  . Ran Out of Food in the Last Year: Not on file  Transportation Needs:   . Lack of Transportation (Medical): Not on file  . Lack of Transportation (Non-Medical): Not on file  Physical Activity:   . Days of Exercise per Week: Not on file  . Minutes of Exercise per Session: Not on file  Stress:   . Feeling of Stress : Not on file  Social Connections:   . Frequency of Communication with Friends and Family: Not on file  . Frequency of Social Gatherings with Friends and Family: Not on file  . Attends Religious Services: Not on file  . Active Member of Clubs or Organizations: Not on file  . Attends Archivist Meetings: Not on file  . Marital Status: Not on file  Intimate Partner Violence:   . Fear of Current or Ex-Partner: Not on file  . Emotionally Abused: Not on file  . Physically Abused: Not on file  . Sexually Abused: Not on file          Vanessa Kick, MD 04/01/19 [phone]

## 2019-04-01 NOTE — Discharge Instructions (Addendum)
We have sent testing for sexually transmitted infections. We will notify you of any positive results once they are received. If required, we will prescribe any medications you might need.  Please refrain from all sexual activity for at least the next seven days.  You have been tested for COVID-19 today. If your test returns positive, you will receive a phone call from [facility] regarding your results. Negative test results are not called. Both positive and negative results area always visible on MyChart. If you do not have a MyChart account, sign up instructions are provided in your discharge papers. Please do not hesitate to contact us should you have questions or concerns.   

## 2019-04-01 NOTE — ED Triage Notes (Signed)
Pt here for a Covid test to go back to work. She is a bit cryptic with why she needs the test for work.  States "its going around real bad at work". 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  Pt also here for green vaginal discharge x1 week.  Pt was treated for STD's one month ago and states that she was better from that.  Pt states she is pregnant.

## 2019-04-03 LAB — NOVEL CORONAVIRUS, NAA (HOSP ORDER, SEND-OUT TO REF LAB; TAT 18-24 HRS): SARS-CoV-2, NAA: NOT DETECTED

## 2019-04-05 LAB — CERVICOVAGINAL ANCILLARY ONLY
Bacterial vaginitis: POSITIVE — AB
Candida vaginitis: POSITIVE — AB
Chlamydia: NEGATIVE
Neisseria Gonorrhea: NEGATIVE
Trichomonas: NEGATIVE

## 2019-04-06 ENCOUNTER — Telehealth (HOSPITAL_COMMUNITY): Payer: Self-pay | Admitting: Emergency Medicine

## 2019-04-06 MED ORDER — METRONIDAZOLE 0.75 % VA GEL: 1.0000 | Freq: Every day | VAGINAL | 0 refills | Status: DC

## 2019-04-06 MED ORDER — TERCONAZOLE 0.4 % VA CREA: 1.0000 | TOPICAL_CREAM | Freq: Every day | VAGINAL | 0 refills | Status: AC

## 2019-04-06 NOTE — Telephone Encounter (Signed)
Bacterial vaginosis is positive. Pt needs treatment. Flagyl metro gel no refills sent to patients pharmacy of choice.    Test for candida (yeast) was positive.  Prescription for terconazole, sent to the pharmacy of record.  Recheck or followup with PCP for further evaluation if symptoms are not improving.    Patient contacted by phone and made aware of    results. Pt verbalized understanding and had all questions answered.

## 2019-04-12 ENCOUNTER — Encounter: Payer: Self-pay | Admitting: Obstetrics

## 2019-04-12 ENCOUNTER — Telehealth (INDEPENDENT_AMBULATORY_CARE_PROVIDER_SITE_OTHER): Payer: BC Managed Care – PPO | Admitting: Obstetrics

## 2019-04-12 DIAGNOSIS — O98211 Gonorrhea complicating pregnancy, first trimester: Secondary | ICD-10-CM

## 2019-04-12 DIAGNOSIS — Z3480 Encounter for supervision of other normal pregnancy, unspecified trimester: Secondary | ICD-10-CM

## 2019-04-12 DIAGNOSIS — Z3A15 15 weeks gestation of pregnancy: Secondary | ICD-10-CM

## 2019-04-12 NOTE — Progress Notes (Signed)
S/w pt for mychart visit. Pt does not have BP cuff with her today. Pt states she is not feeling fetal movement yet, denies pain.

## 2019-04-12 NOTE — Progress Notes (Signed)
   TELEHEALTH OBSTETRICS PRENATAL VIRTUAL VIDEO VISIT ENCOUNTER NOTE  Provider location: Center for [facility] [place]   I connected with Erika Bailey on 04/12/19 at  2:15 PM EST by [facility] MyChart Video Encounter at home and verified that I am speaking with the correct person using two identifiers.   I discussed the limitations, risks, security and privacy concerns of performing an evaluation and management service virtually and the availability of in person appointments. I also discussed with the patient that there may be a patient responsible charge related to this service. The patient expressed understanding and agreed to proceed. Subjective:  Erika Bailey is a 19 y.o. G2P0010 at [redacted]w[redacted]d being seen today for ongoing prenatal care.  She is currently monitored for the following issues for this low-risk pregnancy and has Supervision of other normal pregnancy, antepartum; Gonorrhea affecting pregnancy in first trimester; and Headache in pregnancy, antepartum, first trimester on their problem list.  Patient reports no complaints.  Contractions: Not present. Vag. Bleeding: None.   . Denies any leaking of fluid.   The following portions of the patient's history were reviewed and updated as appropriate: allergies, current medications, past family history, past medical history, past social history, past surgical history and problem list.   Objective:  There were no vitals filed for this visit.  Fetal Status:           General:  Alert, oriented and cooperative. Patient is in no acute distress.  Respiratory: Normal respiratory effort, no problems with respiration noted  Mental Status: Normal mood and affect. Normal behavior. Normal judgment and thought content.  Rest of physical exam deferred due to type of encounter  Imaging: No results found.  Assessment and Plan:  Pregnancy: G2P0010 at [redacted]w[redacted]d 1. Supervision of other normal pregnancy, antepartum  2. Gonorrhea affecting  pregnancy in first trimester - treated  Preterm labor symptoms and general obstetric precautions including but not limited to vaginal bleeding, contractions, leaking of fluid and fetal movement were reviewed in detail with the patient. I discussed the assessment and treatment plan with the patient. The patient was provided an opportunity to ask questions and all were answered. The patient agreed with the plan and demonstrated an understanding of the instructions. The patient was advised to call back or seek an in-person office evaluation/go to MAU at [facility] for any urgent or concerning symptoms. Please refer to After Visit Summary for other counseling recommendations.   I provided 10 minutes of face-to-face time during this encounter.  Return for MyChart.  Future Appointments  Date Time Provider Department Center  05/10/2019  2:00 PM Brock Bad, MD [facility] None    Coral Ceo, MD Center for [facility], [facility] 04/12/2019

## 2019-04-15 ENCOUNTER — Other Ambulatory Visit: Payer: Self-pay

## 2019-04-15 DIAGNOSIS — Z3480 Encounter for supervision of other normal pregnancy, unspecified trimester: Secondary | ICD-10-CM

## 2019-04-15 MED ORDER — PREPLUS 27-1 MG PO TABS: 1.0000 | ORAL_TABLET | Freq: Every day | ORAL | 13 refills | Status: DC

## 2019-04-15 NOTE — Progress Notes (Signed)
Rx prenatal vitamins sent to pts preferred pharmacy, pt made aware.

## 2019-05-05 ENCOUNTER — Inpatient Hospital Stay (HOSPITAL_COMMUNITY): Payer: BC Managed Care – PPO

## 2019-05-05 ENCOUNTER — Inpatient Hospital Stay (HOSPITAL_COMMUNITY)
Admission: AD | Admit: 2019-05-05 | Discharge: 2019-05-05 | Disposition: A | Discharge status: Home / Self Care | Payer: BC Managed Care – PPO | Attending: Obstetrics and Gynecology | Admitting: Obstetrics and Gynecology

## 2019-05-05 ENCOUNTER — Telehealth: Payer: Self-pay

## 2019-05-05 ENCOUNTER — Other Ambulatory Visit: Payer: Self-pay

## 2019-05-05 ENCOUNTER — Encounter (HOSPITAL_COMMUNITY): Payer: Self-pay | Admitting: Obstetrics and Gynecology

## 2019-05-05 DIAGNOSIS — O2342 Unspecified infection of urinary tract in pregnancy, second trimester: Secondary | ICD-10-CM

## 2019-05-05 DIAGNOSIS — R1011 Right upper quadrant pain: Secondary | ICD-10-CM | POA: Insufficient documentation

## 2019-05-05 DIAGNOSIS — Z3A19 19 weeks gestation of pregnancy: Secondary | ICD-10-CM | POA: Diagnosis not present

## 2019-05-05 DIAGNOSIS — Z87891 Personal history of nicotine dependence: Secondary | ICD-10-CM | POA: Diagnosis not present

## 2019-05-05 DIAGNOSIS — M549 Dorsalgia, unspecified: Secondary | ICD-10-CM

## 2019-05-05 DIAGNOSIS — O26892 Other specified pregnancy related conditions, second trimester: Secondary | ICD-10-CM | POA: Diagnosis present

## 2019-05-05 DIAGNOSIS — N390 Urinary tract infection, site not specified: Secondary | ICD-10-CM | POA: Insufficient documentation

## 2019-05-05 DIAGNOSIS — M546 Pain in thoracic spine: Secondary | ICD-10-CM | POA: Insufficient documentation

## 2019-05-05 LAB — CBC WITH DIFFERENTIAL/PLATELET
Abs Immature Granulocytes: 0.06 10*3/uL (ref 0.00–0.07)
Basophils Absolute: 0 10*3/uL (ref 0.0–0.1)
Basophils Relative: 0 %
Eosinophils Absolute: 0 10*3/uL (ref 0.0–0.5)
Eosinophils Relative: 0 %
HCT: 32.3 % — ABNORMAL LOW (ref 36.0–46.0)
Hemoglobin: 10.6 g/dL — ABNORMAL LOW (ref 12.0–15.0)
Immature Granulocytes: 1 %
Lymphocytes Relative: 9 %
Lymphs Abs: 1 10*3/uL (ref 0.7–4.0)
MCH: 28.6 pg (ref 26.0–34.0)
MCHC: 32.8 g/dL (ref 30.0–36.0)
MCV: 87.1 fL (ref 80.0–100.0)
Monocytes Absolute: 0.7 10*3/uL (ref 0.1–1.0)
Monocytes Relative: 7 %
Neutro Abs: 9 10*3/uL — ABNORMAL HIGH (ref 1.7–7.7)
Neutrophils Relative %: 83 %
Platelets: 247 10*3/uL (ref 150–400)
RBC: 3.71 MIL/uL — ABNORMAL LOW (ref 3.87–5.11)
RDW: 14.6 % (ref 11.5–15.5)
WBC: 10.8 10*3/uL — ABNORMAL HIGH (ref 4.0–10.5)
nRBC: 0 % (ref 0.0–0.2)

## 2019-05-05 LAB — URINALYSIS, ROUTINE W REFLEX MICROSCOPIC
Bilirubin Urine: NEGATIVE
Glucose, UA: NEGATIVE mg/dL
Hgb urine dipstick: NEGATIVE
Ketones, ur: NEGATIVE mg/dL
Nitrite: NEGATIVE
Protein, ur: NEGATIVE mg/dL
Specific Gravity, Urine: 1.006 (ref 1.005–1.030)
WBC, UA: >50 WBC/hpf — ABNORMAL HIGH (ref 0–5)
pH: 6 (ref 5.0–8.0)

## 2019-05-05 LAB — COMPREHENSIVE METABOLIC PANEL
ALT: 10 U/L (ref 0–44)
AST: 20 U/L (ref 15–41)
Albumin: 3 g/dL — ABNORMAL LOW (ref 3.5–5.0)
Alkaline Phosphatase: 45 U/L (ref 38–126)
Anion gap: 8 (ref 5–15)
BUN: 7 mg/dL (ref 6–20)
CO2: 21 mmol/L — ABNORMAL LOW (ref 22–32)
Calcium: 8.8 mg/dL — ABNORMAL LOW (ref 8.9–10.3)
Chloride: 106 mmol/L (ref 98–111)
Creatinine, Ser: 0.57 mg/dL (ref 0.44–1.00)
GFR calc Af Amer: >60 mL/min (ref >60)
GFR calc non Af Amer: >60 mL/min (ref >60)
Glucose, Bld: 88 mg/dL (ref 70–99)
Potassium: 3.4 mmol/L — ABNORMAL LOW (ref 3.5–5.1)
Sodium: 135 mmol/L (ref 135–145)
Total Bilirubin: 0.2 mg/dL — ABNORMAL LOW (ref 0.3–1.2)
Total Protein: 6.1 g/dL — ABNORMAL LOW (ref 6.5–8.1)

## 2019-05-05 LAB — LIPASE, BLOOD: Lipase: 25 U/L (ref 11–51)

## 2019-05-05 MED ORDER — IBUPROFEN 600 MG PO TABS
600.0000 mg | ORAL_TABLET | Freq: Once | ORAL | Status: AC
  Administered 2019-05-05: 600 mg via ORAL
  Filled 2019-05-05: qty 1

## 2019-05-05 MED ORDER — CEFADROXIL 500 MG PO CAPS: 500.0000 mg | ORAL_CAPSULE | Freq: Two times a day (BID) | ORAL | 0 refills | Status: DC

## 2019-05-05 MED ORDER — SODIUM CHLORIDE 0.9 % IV SOLN
2.0000 g | Freq: Once | INTRAVENOUS | Status: AC
  Administered 2019-05-05: 19:00:00 2 g via INTRAVENOUS
  Filled 2019-05-05: qty 2

## 2019-05-05 MED ORDER — ACETAMINOPHEN 500 MG PO TABS
1000.0000 mg | ORAL_TABLET | Freq: Once | ORAL | Status: AC
  Administered 2019-05-05: 17:00:00 1000 mg via ORAL
  Filled 2019-05-05: qty 2

## 2019-05-05 MED ORDER — SODIUM CHLORIDE 0.9 % IV SOLN: Freq: Once | INTRAVENOUS | Status: AC

## 2019-05-05 MED ORDER — IBUPROFEN 600 MG PO TABS: 600.0000 mg | ORAL_TABLET | Freq: Four times a day (QID) | ORAL | 0 refills | Status: DC | PRN

## 2019-05-05 NOTE — MAU Provider Note (Signed)
History     CSN: [id number]  Arrival date and time: 05/05/19 1531   First Provider Initiated Contact with Patient 05/05/19 1620      Chief Complaint  Patient presents with  . Abdominal Pain  . Vaginal Discharge   HPI   Ms.Erika Bailey is a 19 y.o.  Female G2P0010 @ 30w0dhere with right upper abdominal pain. The pain started this morning. She felt it when she woke up. She currently rates her pain 9/10. No nausea or vomiting. The pain does not worsen when she eats. The pain worsens when she lays down. No fever. The pain is a throbbing pain in her upper abdomen. She has no bleeding or lower abdominal pain.  OB History    Gravida  2   Para      Term      Preterm      AB  1   Living  0     SAB  1   TAB      Ectopic      Multiple      Live Births              Past Medical History:  Diagnosis Date  . Supervision of other normal pregnancy, antepartum 12/18/2017    Nursing Staff Provider Office Location   [facility] Dating   LMP Language   English Anatomy [place]  Flu Vaccine   12/18/17 Genetic Screen  NIPS:   AFP:   First Screen:  Quad:   TDaP vaccine    Hgb A1C or  GTT Early  Third trimester  Rhogam     LAB RESULTS  Feeding Plan  Breast Blood Type    Contraception  Antibody   Circumcision  Rubella   Pediatrician   RPR    Support Person  HBsAg    Prenatal Classes     Past Surgical History:  Procedure Laterality Date  . DILATION AND EVACUATION N/A 01/21/2018   Procedure: DILATATION AND EVACUATION;  Surgeon: AWoodroe Mode MD;  [facility];  Service: Gynecology;  Laterality: N/A;    Family History  Problem Relation Age of Onset  . Healthy Mother   . Healthy Father   . Cancer Maternal Grandmother     Social History   Tobacco Use  . Smoking status: Former Smoker    Quit date: 01/24/2019    Years since quitting: 0.2  . Smokeless tobacco: Never Used  Substance Use Topics  . Alcohol use: Not Currently  . Drug use: Never    Allergies:  Allergies   Allergen Reactions  . Peanuts [Peanut Oil] Itching and Swelling    Facility-Administered Medications Prior to Admission  Medication Dose Route Frequency Provider Last Rate Last Admin  . cefTRIAXone (ROCEPHIN) injection 250 mg  250 mg Intramuscular Once HShelly Bombard MD       Medications Prior to Admission  Medication Sig Dispense Refill Last Dose  . Blood Pressure Monitoring (BLOOD PRESSURE KIT) DEVI 1 kit by Does not apply route once a week. Check BP regularly and record reading into the Babyscripts App. Large Cuff. DX O90.0 1 each 0 Past Month at Unknown time  . Prenatal Vit-Fe Fumarate-FA (PREPLUS) 27-1 MG TABS Take 1 tablet by mouth daily. 30 tablet 13 05/04/2019 at Unknown time   Results for orders placed or performed during the hospital encounter of 05/05/19 (from the past 48 hour(s))  Urinalysis, Routine w reflex microscopic     Status: Abnormal   Collection  Time: 05/05/19  4:14 PM  Result Value Ref Range   Color, Urine YELLOW YELLOW   APPearance HAZY (A) CLEAR   Specific Gravity, Urine 1.006 1.005 - 1.030   pH 6.0 5.0 - 8.0   Glucose, UA NEGATIVE NEGATIVE mg/dL   Hgb urine dipstick NEGATIVE NEGATIVE   Bilirubin Urine NEGATIVE NEGATIVE   Ketones, ur NEGATIVE NEGATIVE mg/dL   Protein, ur NEGATIVE NEGATIVE mg/dL   Nitrite NEGATIVE NEGATIVE   Leukocytes,Ua LARGE (A) NEGATIVE   RBC / HPF 0-5 0 - 5 RBC/hpf   WBC, UA >50 (H) 0 - 5 WBC/hpf   Bacteria, UA RARE (A) NONE SEEN   Squamous Epithelial / LPF 6-10 0 - 5   Mucus PRESENT     Comment: Performed at [facility] [address] [place], [place] [address]  CBC with Differential/Platelet     Status: Abnormal   Collection Time: 05/05/19  4:35 PM  Result Value Ref Range   WBC 10.8 (H) 4.0 - 10.5 K/uL   RBC 3.71 (L) 3.87 - 5.11 MIL/uL   Hemoglobin 10.6 (L) 12.0 - 15.0 g/dL   HCT 32.3 (L) 36.0 - 46.0 %   MCV 87.1 80.0 - 100.0 fL   MCH 28.6 26.0 - 34.0 pg   MCHC 32.8 30.0 - 36.0 g/dL   RDW 14.6 11.5 - 15.5 %    Platelets 247 150 - 400 K/uL   nRBC 0.0 0.0 - 0.2 %   Neutrophils Relative % 83 %   Neutro Abs 9.0 (H) 1.7 - 7.7 K/uL   Lymphocytes Relative 9 %   Lymphs Abs 1.0 0.7 - 4.0 K/uL   Monocytes Relative 7 %   Monocytes Absolute 0.7 0.1 - 1.0 K/uL   Eosinophils Relative 0 %   Eosinophils Absolute 0.0 0.0 - 0.5 K/uL   Basophils Relative 0 %   Basophils Absolute 0.0 0.0 - 0.1 K/uL   Immature Granulocytes 1 %   Abs Immature Granulocytes 0.06 0.00 - 0.07 K/uL    Comment: Performed at [facility], [place] [address] [place], [place] [address]  Comprehensive metabolic panel     Status: Abnormal   Collection Time: 05/05/19  4:35 PM  Result Value Ref Range   Sodium 135 135 - 145 mmol/L   Potassium 3.4 (L) 3.5 - 5.1 mmol/L   Chloride 106 98 - 111 mmol/L   CO2 21 (L) 22 - 32 mmol/L   Glucose, Bld 88 70 - 99 mg/dL    Comment: Glucose reference range applies only to samples taken after fasting for at least 8 hours.   BUN 7 6 - 20 mg/dL   Creatinine, Ser 0.57 0.44 - 1.00 mg/dL   Calcium 8.8 (L) 8.9 - 10.3 mg/dL   Total Protein 6.1 (L) 6.5 - 8.1 g/dL   Albumin 3.0 (L) 3.5 - 5.0 g/dL   AST 20 15 - 41 U/L   ALT 10 0 - 44 U/L   Alkaline Phosphatase 45 38 - 126 U/L   Total Bilirubin 0.2 (L) 0.3 - 1.2 mg/dL   GFR calc non Af Amer >60 >60 mL/min   GFR calc Af Amer >60 >60 mL/min   Anion gap 8 5 - 15    Comment: Performed at [facility] [address] [place], [place] [address]  Lipase, blood     Status: None   Collection Time: 05/05/19  4:35 PM  Result Value Ref Range   Lipase 25 11 - 51 U/L  Comment: Performed at [facility], [place] [address] [place], [place] [address]   Review of Systems  Gastrointestinal: Positive for abdominal pain.  Genitourinary: Positive for flank pain. Negative for dysuria, hematuria, pelvic pain, urgency and vaginal bleeding.   Physical Exam   Blood pressure 114/63, pulse 76, temperature 98.7 F (37.1 C), temperature source Oral, resp. rate 18,  last menstrual period 12/23/2018, SpO2 100 %, unknown if currently breastfeeding.  Physical Exam  Constitutional: She is oriented to person, place, and time. She appears well-developed and well-nourished. No distress.  HENT:  Head: Normocephalic.  GI: Soft. Normal appearance. She exhibits no distension and no mass. There is no abdominal tenderness. There is CVA tenderness (Right CVA tenderness ). There is no rigidity, no rebound and no guarding.  Musculoskeletal:        General: Normal range of motion.  Neurological: She is alert and oriented to person, place, and time.  Skin: Skin is warm. She is not diaphoretic.  Psychiatric: Her behavior is normal.   MAU Course  Procedures  None  MDM  + fetal heart tones via doppler.  Initally on exam patient had right CVA tenderness, tylenol 1 gram was given and her pain went from 9/10 to 0/10 Renal US normal WBC with mild elevated WBC and mild left shift, she is afebrile. Urine is consistent with UTI however no bacteria, given that her pain subsided entirely with tylenol will treat her for out patient UTI. Discussed with Dr. Kerry Dory who agrees with plan of care Rocephin 2 grams given IV.  1930: Rocephin infusing, pain back up to 7/10. Food ordered and ibuprofen 600 mg given PO Pain at discharge 0/10. Strict return precautions discussed.   Assessment and Plan   A:  1. UTI (urinary tract infection) during pregnancy, second trimester   2. Upper back pain   3. [redacted] weeks gestation of pregnancy     P:  Discharge home with strict return precautions Rx: Ibuprofen X 3 days only, and cefadroxil Alternate tylenol and ibuprofen every 6 hours.  F/u Friday in the office for virtual visit d/t some CVA tenderness that subsides with tylenol and ibuprofen.  Urine culture pending.    Erika Lye, NP 05/05/2019 8:29 PM

## 2019-05-05 NOTE — Discharge Instructions (Signed)
Pregnancy and Urinary Tract Infection ° °A urinary tract infection (UTI) is an infection of any part of the urinary tract. This includes the kidneys, the tubes that connect your kidneys to your bladder (ureters), the bladder, and the tube that carries urine out of your body (urethra). These organs make, store, and get rid of urine in the body. Your health care provider may use other names to describe the infection. An upper UTI affects the ureters and kidneys (pyelonephritis). A lower UTI affects the bladder (cystitis) and urethra (urethritis). °Most urinary tract infections are caused by bacteria in your genital area, around the entrance to your urinary tract (urethra). These bacteria grow and cause irritation and inflammation of your urinary tract. You are more likely to develop a UTI during pregnancy because the physical and hormonal changes your body goes through can make it easier for bacteria to get into your urinary tract. Your growing baby also puts pressure on your bladder and can affect urine flow. It is important to recognize and treat UTIs in pregnancy because of the risk of serious complications for both you and your baby. °How does this affect me? °Symptoms of a UTI include: °· Needing to urinate right away (urgently). °· Frequent urination or passing small amounts of urine frequently. °· Pain or burning with urination. °· Blood in the urine. °· Urine that smells bad or unusual. °· Trouble urinating. °· Cloudy urine. °· Pain in the abdomen or lower back. °· Vaginal discharge. °You may also have: °· Vomiting or a decreased appetite. °· Confusion. °· Irritability or tiredness. °· A fever. °· Diarrhea. °How does this affect my baby? °An untreated UTI during pregnancy could lead to a kidney infection or a systemic infection, which can cause health problems that could affect your baby. Possible complications of an untreated UTI include: °· Giving birth to your baby before 37 weeks of pregnancy  (premature). °· Having a baby with a low birth weight. °· Developing high blood pressure during pregnancy (preeclampsia). °· Having a low hemoglobin level (anemia). °What can I do to lower my risk? °To prevent a UTI: °· Go to the bathroom as soon as you feel the need. Do not hold urine for long periods of time. °· Always wipe from front to back, especially after a bowel movement. Use each tissue one time when you wipe. °· Empty your bladder after sex. °· Keep your genital area dry. °· Drink 6-10 glasses of water each day. °· Do not douche or use deodorant sprays. °How is this treated? °Treatment for this condition may include: °· Antibiotic medicines that are safe to take during pregnancy. °· Other medicines to treat less common causes of UTI. °Follow these instructions at home: °· If you were prescribed an antibiotic medicine, take it as told by your health care provider. Do not stop using the antibiotic even if you start to feel better. °· Keep all follow-up visits as told by your health care provider. This is important. °Contact a health care provider if: °· Your symptoms do not improve or they get worse. °· You have abnormal vaginal discharge. °Get help right away if you: °· Have a fever. °· Have nausea and vomiting. °· Have back or side pain. °· Feel contractions in your uterus. °· Have lower belly pain. °· Have a gush of fluid from your vagina. °· Have blood in your urine. °Summary °· A urinary tract infection (UTI) is an infection of any part of the urinary tract, which includes the   kidneys, ureters, bladder, and urethra. °· Most urinary tract infections are caused by bacteria in your genital area, around the entrance to your urinary tract (urethra). °· You are more likely to develop a UTI during pregnancy. °· If you were prescribed an antibiotic medicine, take it as told by your health care provider. Do not stop using the antibiotic even if you start to feel better. °This information is not intended to  replace advice given to you by your health care provider. Make sure you discuss any questions you have with your health care provider. °Document Revised: 06/19/2018 Document Reviewed: 01/29/2018 °Elsevier Patient Education © 2020 [employer] Inc. ° °

## 2019-05-05 NOTE — MAU Note (Signed)
Pt states that she is having sharp cramping that started yesterday.   Denies vaginal bleeding or LOF.   Reports new vaginal discharge that is thick.

## 2019-05-05 NOTE — Telephone Encounter (Signed)
Pt called and reports that she is having sharp pains in her abdomen and lower back that started this morning. Pt denies any bleeding or LOF. Pt also report she is experiencing some dysuria and abnormal vaginal discharge. I advised pt go to the hospital for evaluation at this time. Pt voices understanding.

## 2019-05-07 ENCOUNTER — Telehealth: Payer: BC Managed Care – PPO

## 2019-05-07 DIAGNOSIS — Z3480 Encounter for supervision of other normal pregnancy, unspecified trimester: Secondary | ICD-10-CM

## 2019-05-07 NOTE — Progress Notes (Signed)
Attempted to call x 3.  No answer.  Provider waited on mychart virtual space for >29min and patient never joined.  Will call and reschedule.  Cherre Robins MSN, CNM Advanced Practice Provider, Center for [employer]

## 2019-05-07 NOTE — Progress Notes (Signed)
ROB  Per notes F/U from MAU on 05/05/19

## 2019-05-08 LAB — CULTURE, OB URINE: Culture: >=100000 — AB

## 2019-05-10 ENCOUNTER — Telehealth (INDEPENDENT_AMBULATORY_CARE_PROVIDER_SITE_OTHER): Payer: BC Managed Care – PPO | Admitting: Obstetrics

## 2019-05-10 ENCOUNTER — Encounter: Payer: Self-pay | Admitting: Obstetrics

## 2019-05-10 DIAGNOSIS — O98212 Gonorrhea complicating pregnancy, second trimester: Secondary | ICD-10-CM

## 2019-05-10 DIAGNOSIS — Z3A19 19 weeks gestation of pregnancy: Secondary | ICD-10-CM

## 2019-05-10 DIAGNOSIS — O0990 Supervision of high risk pregnancy, unspecified, unspecified trimester: Secondary | ICD-10-CM

## 2019-05-10 DIAGNOSIS — O98211 Gonorrhea complicating pregnancy, first trimester: Secondary | ICD-10-CM

## 2019-05-10 NOTE — Progress Notes (Signed)
   TELEHEALTH VIRTUAL OBSTETRICS VISIT ENCOUNTER NOTE  I connected with Erika Bailey on 05/10/19 at  2:00 PM EST by telephone at home and verified that I am speaking with the correct person using two identifiers.   I discussed the limitations, risks, security and privacy concerns of performing an evaluation and management service by telephone and the availability of in person appointments. I also discussed with the patient that there may be a patient responsible charge related to this service. The patient expressed understanding and agreed to proceed.  Subjective:  Erika Bailey is a 19 y.o. G2P0010 at [redacted]w[redacted]d being followed for ongoing prenatal care.  She is currently monitored for the following issues for this high-risk pregnancy and has Supervision of other normal pregnancy, antepartum; Gonorrhea affecting pregnancy in first trimester; and Headache in pregnancy, antepartum, first trimester on their problem list.  Patient reports no complaints. Reports fetal movement. Denies any contractions, bleeding or leaking of fluid.   The following portions of the patient's history were reviewed and updated as appropriate: allergies, current medications, past family history, past medical history, past social history, past surgical history and problem list.   Objective:   General:  Alert, oriented and cooperative.   Mental Status: Normal mood and affect perceived. Normal judgment and thought content.  Rest of physical exam deferred due to type of encounter  Assessment and Plan:  Pregnancy: G2P0010 at [redacted]w[redacted]d 1. Supervision of other normal pregnancy, antepartum Rx: - [place] MFM OB COMP + 14 WK; Future  2. Gonorrhea affecting pregnancy in first trimester, treated   Preterm labor symptoms and general obstetric precautions including but not limited to vaginal bleeding, contractions, leaking of fluid and fetal movement were reviewed in detail with the patient.  I discussed the assessment and  treatment plan with the patient. The patient was provided an opportunity to ask questions and all were answered. The patient agreed with the plan and demonstrated an understanding of the instructions. The patient was advised to call back or seek an in-person office evaluation/go to MAU at [facility] for any urgent or concerning symptoms. Please refer to After Visit Summary for other counseling recommendations.   I provided 10 minutes of non-face-to-face time during this encounter.  Return in about 4 weeks (around 06/07/2019) for MyChart.   Coral Ceo, MD Center for [facility], [facility] 05/10/2019

## 2019-05-10 NOTE — Progress Notes (Signed)
Pt is on the phone preparing for virtual visit with provider. [redacted]w[redacted]d. Pt does not have anatomy scan [place] scheduled yet. Pt is not with her BP cuff at the moment, pt denies HA's, blurry vision, or abdominal pain.

## 2019-05-17 ENCOUNTER — Other Ambulatory Visit: Payer: Self-pay

## 2019-05-17 ENCOUNTER — Other Ambulatory Visit: Payer: Self-pay | Admitting: Obstetrics

## 2019-05-17 ENCOUNTER — Ambulatory Visit (HOSPITAL_COMMUNITY)
Admission: RE | Admit: 2019-05-17 | Discharge: 2019-05-17 | Disposition: A | Discharge status: Home / Self Care | Payer: BC Managed Care – PPO | Source: Ambulatory Visit | Attending: Obstetrics | Admitting: Obstetrics

## 2019-05-17 DIAGNOSIS — Z3A20 20 weeks gestation of pregnancy: Secondary | ICD-10-CM | POA: Diagnosis not present

## 2019-05-17 DIAGNOSIS — Z87798 Personal history of other (corrected) congenital malformations: Secondary | ICD-10-CM | POA: Diagnosis not present

## 2019-05-17 DIAGNOSIS — O0990 Supervision of high risk pregnancy, unspecified, unspecified trimester: Secondary | ICD-10-CM

## 2019-05-17 DIAGNOSIS — O359XX0 Maternal care for (suspected) fetal abnormality and damage, unspecified, not applicable or unspecified: Secondary | ICD-10-CM

## 2019-05-17 DIAGNOSIS — Z363 Encounter for antenatal screening for malformations: Secondary | ICD-10-CM

## 2019-05-24 ENCOUNTER — Other Ambulatory Visit: Payer: Self-pay

## 2019-05-24 MED ORDER — METRONIDAZOLE 0.75 % VA GEL: 1.0000 | Freq: Every day | VAGINAL | 0 refills | Status: AC

## 2019-05-24 NOTE — Progress Notes (Signed)
Rx Metrogel sent for pt for BV symptoms per protocol. Pt made aware.

## 2019-06-07 ENCOUNTER — Telehealth (INDEPENDENT_AMBULATORY_CARE_PROVIDER_SITE_OTHER): Payer: Medicaid Other | Admitting: Obstetrics

## 2019-06-07 DIAGNOSIS — N760 Acute vaginitis: Secondary | ICD-10-CM

## 2019-06-07 DIAGNOSIS — Z3A23 23 weeks gestation of pregnancy: Secondary | ICD-10-CM

## 2019-06-07 DIAGNOSIS — Z3480 Encounter for supervision of other normal pregnancy, unspecified trimester: Secondary | ICD-10-CM

## 2019-06-07 DIAGNOSIS — O98812 Other maternal infectious and parasitic diseases complicating pregnancy, second trimester: Secondary | ICD-10-CM

## 2019-06-07 DIAGNOSIS — B9689 Other specified bacterial agents as the cause of diseases classified elsewhere: Secondary | ICD-10-CM

## 2019-06-07 MED ORDER — TINIDAZOLE 500 MG PO TABS: 1000.0000 mg | ORAL_TABLET | Freq: Every day | ORAL | 2 refills | Status: DC

## 2019-06-07 NOTE — Progress Notes (Signed)
OBSTETRICS PRENATAL VIRTUAL VISIT ENCOUNTER NOTE  Provider location: Center for [facility]   I connected with [name] on 06/07/19 at  2:00 PM EDT by MyChart Video Encounter at home and verified that I am speaking with the correct person using two identifiers.   I discussed the limitations, risks, security and privacy concerns of performing an evaluation and management service virtually and the availability of in person appointments. I also discussed with the patient that there may be a patient responsible charge related to this service. The patient expressed understanding and agreed to proceed. Subjective:  Erika Bailey is a 19 y.o. G2P0010 at [redacted]w[redacted]d being seen today for ongoing prenatal care.  She is currently monitored for the following issues for this low-risk pregnancy and has Supervision of other normal pregnancy, antepartum; Gonorrhea affecting pregnancy in first trimester; and Headache in pregnancy, antepartum, first trimester on their problem list.  Patient reports malodorous vaginal discharge.  Contractions: Not present. Vag. Bleeding: None.  Movement: Present. Denies any leaking of fluid.   The following portions of the patient's history were reviewed and updated as appropriate: allergies, current medications, past family history, past medical history, past social history, past surgical history and problem list.   Objective:  There were no vitals filed for this visit.  Fetal Status:     Movement: Present     General:  Alert, oriented and cooperative. Patient is in no acute distress.  Respiratory: Normal respiratory effort, no problems with respiration noted  Mental Status: Normal mood and affect. Normal behavior. Normal judgment and thought content.  Rest of physical exam deferred due to type of encounter  Imaging: [place] MFM OB DETAIL +14 WK  Result Date: 05/17/2019 ----------------------------------------------------------------------  OBSTETRICS  REPORT                       (Signed Final 05/17/2019 10:08 am) ---------------------------------------------------------------------- Patient Info  ID #:       [id number]                          D.O.B.:  03/20/2000 (19 yrs)  Name:       Erika Bailey               Visit Date: 05/17/2019 08:49 am ---------------------------------------------------------------------- Performed By  Performed By:     Eden Lathe BS      Ref. Address:     [address]                    [address]                                                             [address]                                                             [phone]  [place]                                                             [id number]  Attending:        Ma Rings MD         [facility]  Referred By:      [facility] ---------------------------------------------------------------------- Orders   #  Description                          Code         Ordered By   1  [place] MFM OB DETAIL +14 WK              76811.01     Coral Ceo  ----------------------------------------------------------------------   #  Order #                    Accession #                 Episode #   1  [id number]                  [id number]                  [id number]  ---------------------------------------------------------------------- Indications   Fetal abnormality - other known or             O35.9XX0   suspected (polydactyly)   Antenatal screening for malformations          Z36.3   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 Cord Insertion:      Visualized  Amniotic Fluid  AFI FV:      Within normal limits                              Largest Pocket(cm)                              5.9 ---------------------------------------------------------------------- Biometry  BPD:      50.3  mm     G. Age:  21w 2d         70  %    CI:  70.12   %    70 - 86                                                          FL/HC:      18.1   %    15.9 - 20.3  HC:      191.6  mm     G. Age:  21w 3d         72  %    HC/AC:      1.19        1.06 - 1.25  AC:      161.4  mm     G. Age:  21w 2d         61  %    FL/BPD:     68.8   %  FL:       34.6  mm     G. Age:  20w 6d         48  %    FL/AC:      21.4   %    20 - 24  HUM:      33.2  mm     G. Age:  21w 1d         62  %  CER:      23.9  mm     G. Age:  22w 0d         83  %  CM:        2.9  mm  Est. FW:     400  gm    0 lb 14 oz      67  % ---------------------------------------------------------------------- OB History  Gravidity:    2         Term:   0        Prem:   0        SAB:   1  TOP:          0       Ectopic:  0        Living: 0 ---------------------------------------------------------------------- Gestational Age  LMP:           20w 5d        Date:  12/23/18                 EDD:   09/29/19  U/S Today:     21w 2d                                        EDD:   09/25/19  Best:          20w 5d     Det. By:  LMP  (12/23/18)          EDD:   09/29/19 ---------------------------------------------------------------------- Anatomy  Cranium:               Appears normal         Aortic Arch:            Appears normal  Cavum:                 Appears normal  Ductal Arch:            Appears normal  Ventricles:            Appears normal         Diaphragm:              Appears normal  Choroid Plexus:        Appears normal         Stomach:                Appears normal, left                                                                        sided  Cerebellum:            Appears  normal         Abdomen:                Appears normal  Posterior Fossa:       Appears normal         Abdominal Wall:         Appears nml (cord                                                                        insert, abd wall)  Nuchal Fold:           Appears normal         Cord Vessels:           Appears normal (3                                                                        vessel cord)  Face:                  Appears normal         Kidneys:                Appear normal                         (orbits and profile)  Lips:                  Appears normal         Bladder:                Appears normal  Thoracic:              Appears normal         Spine:                  Appears normal  Heart:  Appears normal         Upper Extremities:      Polydactyly                         (4CH, axis, and                         situs)  RVOT:                  Appears normal         Lower Extremities:      Appears normal  LVOT:                  Appears normal  Other:  Heels/feet visualized. Nasal bone visualized. ---------------------------------------------------------------------- Cervix Uterus Adnexa  Cervix  Length:            3.2  cm.  Normal appearance by transabdominal scan.  Uterus  No abnormality visualized.  Left Ovary  Not visualized.  Right Ovary  Not visualized.  Cul De Sac  No free fluid seen.  Adnexa  No abnormality visualized. ---------------------------------------------------------------------- Comments  This patient was seen for a detailed fetal anatomy scan. She  denies any significant past medical history and denies any  problems in her current pregnancy.  The patient has not had any screening test for fetal  aneuploidy drawn in her current pregnancy.  She was informed that the fetal growth and amniotic fluid  level were appropriate for her gestational age.  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  The patient was advised that there is a high probability that  her  baby has 6 digits on his hands.  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  She was advised to have her baby examined after  birth to determine the necessary treatment for the polydactyly  noted today.  The patient was informed that anomalies may be missed due  to technical limitations. If the fetus is in a suboptimal position  or maternal habitus is increased, visualization of the fetus in  the maternal uterus may be impaired.  Follow up as indicated. ----------------------------------------------------------------------                   Ma RingsVictor Fang, MD Electronically Signed Final Report   05/17/2019 10:08 am ----------------------------------------------------------------------   Assessment and Plan:  Pregnancy: G2P0010 at 5147w5d 1. Supervision of other normal pregnancy, antepartum  2. BV (bacterial vaginosis) Rx: - tinidazole (TINDAMAX) 500 MG tablet; Take 2 tablets (1,000 mg total) by mouth daily with breakfast.  Dispense: 10 tablet; Refill: 2  Preterm labor symptoms and general obstetric precautions including but not limited to vaginal bleeding, contractions, leaking of fluid and fetal movement were reviewed in detail with the patient. I discussed the assessment and treatment plan with the patient. The patient was provided an opportunity to ask questions and all were answered. The patient agreed with the plan and demonstrated an understanding of the instructions. The patient was advised to call back or seek an in-person office evaluation/[facility] for any urgent or concerning symptoms. Please refer to After Visit Summary for other counseling recommendations.   I provided 10 minutes of face-to-face time during this encounter.  Return in about 4 weeks (around 07/05/2019) for ROB, 2 hour  OGTT.    Coral Ceo, MD Center for [facility], [facility] 06/07/2019

## 2019-07-05 ENCOUNTER — Ambulatory Visit (INDEPENDENT_AMBULATORY_CARE_PROVIDER_SITE_OTHER): Payer: Medicaid Other | Admitting: Advanced Practice Midwife

## 2019-07-05 ENCOUNTER — Other Ambulatory Visit: Payer: Medicaid Other

## 2019-07-05 ENCOUNTER — Other Ambulatory Visit: Payer: Self-pay

## 2019-07-05 VITALS — BP 109/73 | HR 81 | Wt 154.0 lb

## 2019-07-05 DIAGNOSIS — Z3A27 27 weeks gestation of pregnancy: Secondary | ICD-10-CM

## 2019-07-05 DIAGNOSIS — Z3480 Encounter for supervision of other normal pregnancy, unspecified trimester: Secondary | ICD-10-CM | POA: Diagnosis not present

## 2019-07-05 DIAGNOSIS — O98212 Gonorrhea complicating pregnancy, second trimester: Secondary | ICD-10-CM

## 2019-07-05 DIAGNOSIS — O98211 Gonorrhea complicating pregnancy, first trimester: Secondary | ICD-10-CM

## 2019-07-05 DIAGNOSIS — Z9189 Other specified personal risk factors, not elsewhere classified: Secondary | ICD-10-CM

## 2019-07-05 NOTE — Progress Notes (Signed)
Patient reports fetal movement, denies pain. 

## 2019-07-05 NOTE — Progress Notes (Addendum)
PRENATAL VISIT NOTE  Subjective:  Erika Bailey is a 19 y.o. G2P0010 at [redacted]w[redacted]d being seen today for ongoing prenatal care.  She is currently monitored for the following issues for this low-risk pregnancy and has Supervision of other normal pregnancy, antepartum; Gonorrhea affecting pregnancy in first trimester; and Headache in pregnancy, antepartum, first trimester on their problem list.  Patient reports fetal movement and no bleeding, no contractions and no cramping. Denies leaking of fluid, nausea/vomiting, or other concerns at this time.  The following portions of the patient's history were reviewed and updated as appropriate: allergies, current medications, past family history, past medical history, past social history, past surgical history and problem list.   Objective:   Vitals:   07/05/19 0831  BP: 109/73  Pulse: 81  Weight: 154 lb (69.9 kg)    Fetal Status: Fetal Heart Rate (bpm): 143         General:  Alert, oriented and cooperative. Patient is in no acute distress.  Skin: Skin is warm and dry. No rash noted.   Cardiovascular: Normal heart rate noted  Respiratory: Normal respiratory effort, no problems with respiration noted  Abdomen: Soft, gravid, appropriate for gestational age.        Pelvic: Cervical exam deferred        Extremities: Normal range of motion.     Mental Status: Normal mood and affect. Normal behavior. Normal judgment and thought content.   Assessment and Plan:  Pregnancy: G2P0010 at [redacted]w[redacted]d 1. Gonorrhea affecting pregnancy in first trimester - Tested negative 04/01/19  2. Supervision of other normal pregnancy, antepartum   3. At risk for sexually transmitted disease due to partner with genital herpes - Discussed risk factors and complications of herpes to a newborn. Pt agreed to suppression tx starting 36wk  Preterm labor symptoms and general obstetric precautions including but not limited to vaginal bleeding, contractions, leaking of fluid and  fetal movement were reviewed in detail with the patient. Please refer to After Visit Summary for other counseling recommendations.   No follow-ups on file.  32 wk Virtual Visit to be scheduled.   Laural Benes, MS3  CNM attestation:  I have seen and examined this patient and was present for all history taking and examination.  I agree with above documentation in the medical student's note.   Erika Bailey is a 19 y.o. G2P0010 in the [facility] office for routine prenatal visit for low risk pregnancy. See problem list below. +FM, denies LOF, VB, contractions, vaginal discharge.  Patient Active Problem List   Diagnosis Date Noted  . Gonorrhea affecting pregnancy in first trimester 03/15/2019  . Headache in pregnancy, antepartum, first trimester 03/15/2019  . Supervision of other normal pregnancy, antepartum 02/10/2019     ROS, labs, PMH reviewed  PE: BP 109/73   Pulse 81   Wt 154 lb (69.9 kg)   LMP 12/23/2018 (Exact Date)   BMI 24.12 kg/m  Gen: calm comfortable, well appearing Resp: normal effort, no distress Abd: gravid appropriate for gestational age  Fundal height: FHT by doppler:  Plan: - fetal kick counts reinforced, preterm labor precautions -    1. Gonorrhea affecting pregnancy in first trimester --Neg TOC, follow up with 36 week lab as scheduled  2. Supervision of other normal pregnancy, antepartum --next visit in 4 weeks virtual - Glucose Tolerance, 2 Hours w/1 Hour - RPR - HIV antibody (with reflex) - CBC  3. At risk for sexually transmitted disease due to partner with genital herpes --  Shared decision making with pt as she has never had an outbreak or tested positive.   --Valtrex to start at 36 weeks for suppression    Fatima Blank, CNM 11:58 AM

## 2019-07-06 LAB — HIV ANTIBODY (ROUTINE TESTING W REFLEX): HIV Screen 4th Generation wRfx: NONREACTIVE

## 2019-07-06 LAB — CBC
Hematocrit: 36.6 % (ref 34.0–46.6)
Hemoglobin: 11.6 g/dL (ref 11.1–15.9)
MCH: 28.2 pg (ref 26.6–33.0)
MCHC: 31.7 g/dL (ref 31.5–35.7)
MCV: 89 fL (ref 79–97)
Platelets: 239 10*3/uL (ref 150–450)
RBC: 4.12 x10E6/uL (ref 3.77–5.28)
RDW: 14.5 % (ref 11.7–15.4)
WBC: 8.7 10*3/uL (ref 3.4–10.8)

## 2019-07-06 LAB — GLUCOSE TOLERANCE, 2 HOURS W/ 1HR
Glucose, 1 hour: 95 mg/dL (ref 65–179)
Glucose, 2 hour: 56 mg/dL — ABNORMAL LOW (ref 65–152)
Glucose, Fasting: 77 mg/dL (ref 65–91)

## 2019-07-06 LAB — RPR: RPR Ser Ql: NONREACTIVE

## 2019-07-08 ENCOUNTER — Other Ambulatory Visit (HOSPITAL_COMMUNITY)
Admission: RE | Admit: 2019-07-08 | Discharge: 2019-07-08 | Disposition: A | Discharge status: Home / Self Care | Payer: Medicaid Other | Source: Ambulatory Visit | Attending: Obstetrics and Gynecology | Admitting: Obstetrics and Gynecology

## 2019-07-08 ENCOUNTER — Other Ambulatory Visit: Payer: Self-pay

## 2019-07-08 ENCOUNTER — Ambulatory Visit: Payer: Medicaid Other | Admitting: *Deleted

## 2019-07-08 DIAGNOSIS — O26892 Other specified pregnancy related conditions, second trimester: Secondary | ICD-10-CM | POA: Diagnosis not present

## 2019-07-08 DIAGNOSIS — N898 Other specified noninflammatory disorders of vagina: Secondary | ICD-10-CM | POA: Diagnosis not present

## 2019-07-08 DIAGNOSIS — Z3480 Encounter for supervision of other normal pregnancy, unspecified trimester: Secondary | ICD-10-CM

## 2019-07-08 NOTE — Progress Notes (Signed)
SUBJECTIVE:  19 y.o. female complains of vaginal dicharge, itching/odor. Denies abnormal vaginal bleeding or significant pelvic pain or fever. No UTI symptoms.  Denies history of known exposure to STD.  Request testing today.  Patient's last menstrual period was 12/23/2018 (exact date).  OBJECTIVE:  She appears well, afebrile.   ASSESSMENT:  Vaginal Discharge  Vaginal Odor/Itch   PLAN:  GC, chlamydia, trichomonas, BVAG, CVAG probe sent to lab. Treatment: To be determined once lab results are received ROV prn if symptoms persist or worsen.

## 2019-07-09 LAB — CERVICOVAGINAL ANCILLARY ONLY
Bacterial Vaginitis (gardnerella): POSITIVE — AB
Candida Glabrata: NEGATIVE
Candida Vaginitis: POSITIVE — AB
Chlamydia: NEGATIVE
Comment: NEGATIVE
Comment: NEGATIVE
Comment: NEGATIVE
Comment: NEGATIVE
Comment: NEGATIVE
Comment: NORMAL
Neisseria Gonorrhea: NEGATIVE
Trichomonas: NEGATIVE

## 2019-07-12 ENCOUNTER — Telehealth: Payer: Self-pay | Admitting: *Deleted

## 2019-07-12 MED ORDER — METRONIDAZOLE 500 MG PO TABS: 500.0000 mg | ORAL_TABLET | Freq: Two times a day (BID) | ORAL | 0 refills | Status: DC

## 2019-07-12 MED ORDER — TERCONAZOLE 0.4 % VA CREA: 1.0000 | TOPICAL_CREAM | Freq: Every day | VAGINAL | 0 refills | Status: DC

## 2019-07-12 NOTE — Telephone Encounter (Signed)
Pt informed or results and medications being sent in for her

## 2019-07-12 NOTE — Telephone Encounter (Signed)
-----   Message from Hermina Staggers, MD sent at 07/09/2019  2:52 PM EDT ----- Please send in Rx for Flagy 500 mg po bid x 7 days for BV and Terazol vaginal cream 1 applicator qhs x 7 days for yeast  Thanks Casimiro Needle

## 2019-07-26 ENCOUNTER — Other Ambulatory Visit: Payer: Self-pay

## 2019-07-26 MED ORDER — METRONIDAZOLE 500 MG PO TABS: 500.0000 mg | ORAL_TABLET | Freq: Two times a day (BID) | ORAL | 0 refills | Status: DC

## 2019-08-02 ENCOUNTER — Encounter: Payer: Self-pay | Admitting: Obstetrics

## 2019-08-02 ENCOUNTER — Telehealth (INDEPENDENT_AMBULATORY_CARE_PROVIDER_SITE_OTHER): Payer: Medicaid Other | Admitting: Obstetrics

## 2019-08-02 DIAGNOSIS — Z3480 Encounter for supervision of other normal pregnancy, unspecified trimester: Secondary | ICD-10-CM

## 2019-08-02 DIAGNOSIS — M549 Dorsalgia, unspecified: Secondary | ICD-10-CM

## 2019-08-02 DIAGNOSIS — O98213 Gonorrhea complicating pregnancy, third trimester: Secondary | ICD-10-CM

## 2019-08-02 DIAGNOSIS — Z3A31 31 weeks gestation of pregnancy: Secondary | ICD-10-CM

## 2019-08-02 MED ORDER — COMFORT FIT MATERNITY SUPP SM MISC: 0 refills | Status: DC

## 2019-08-02 NOTE — Progress Notes (Signed)
   OBSTETRICS PRENATAL VIRTUAL VISIT ENCOUNTER NOTE  Provider location: Center for [facility]   I connected with [name] on 08/02/19 at  1:00 PM EDT by MyChart Video Encounter at home and verified that I am speaking with the correct person using two identifiers.   I discussed the limitations, risks, security and privacy concerns of performing an evaluation and management service virtually and the availability of in person appointments. I also discussed with the patient that there may be a patient responsible charge related to this service. The patient expressed understanding and agreed to proceed. Subjective:  Erika Bailey is a 19 y.o. G2P0010 at [redacted]w[redacted]d being seen today for ongoing prenatal care.  She is currently monitored for the following issues for this low-risk pregnancy and has Supervision of other normal pregnancy, antepartum; Gonorrhea affecting pregnancy in first trimester; and Headache in pregnancy, antepartum, first trimester on their problem list.  Patient reports backache.  Contractions: Not present. Vag. Bleeding: None.  Movement: Present. Denies any leaking of fluid.   The following portions of the patient's history were reviewed and updated as appropriate: allergies, current medications, past family history, past medical history, past social history, past surgical history and problem list.   Objective:  There were no vitals filed for this visit.  Fetal Status:     Movement: Present     General:  Alert, oriented and cooperative. Patient is in no acute distress.  Respiratory: Normal respiratory effort, no problems with respiration noted  Mental Status: Normal mood and affect. Normal behavior. Normal judgment and thought content.  Rest of physical exam deferred due to type of encounter  Imaging: No results found.  Assessment and Plan:  Pregnancy: G2P0010 at [redacted]w[redacted]d 1. Supervision of other normal pregnancy, antepartum  2. Backache  symptom Rx: - Elastic Bandages & Supports (COMFORT FIT MATERNITY SUPP SM) MISC; Wear as directed.  Dispense: 1 each; Refill: 0   Preterm labor symptoms and general obstetric precautions including but not limited to vaginal bleeding, contractions, leaking of fluid and fetal movement were reviewed in detail with the patient. I discussed the assessment and treatment plan with the patient. The patient was provided an opportunity to ask questions and all were answered. The patient agreed with the plan and demonstrated an understanding of the instructions. The patient was advised to call back or seek an in-person office evaluation/go to MAU at [facility] for any urgent or concerning symptoms. Please refer to After Visit Summary for other counseling recommendations.   I provided 10 minutes of face-to-face time during this encounter.  Return in about 2 weeks (around 08/16/2019) for MyChart.   Coral Ceo, MD Center for [facility], [facility] 08/02/2019

## 2019-08-02 NOTE — Progress Notes (Signed)
Pt is on the phone preparing for virtual visit with provider. [redacted]w[redacted]d.

## 2019-08-16 ENCOUNTER — Encounter: Payer: Self-pay | Admitting: Obstetrics and Gynecology

## 2019-08-16 ENCOUNTER — Telehealth (INDEPENDENT_AMBULATORY_CARE_PROVIDER_SITE_OTHER): Payer: Medicaid Other | Admitting: Obstetrics and Gynecology

## 2019-08-16 DIAGNOSIS — Z8619 Personal history of other infectious and parasitic diseases: Secondary | ICD-10-CM

## 2019-08-16 DIAGNOSIS — Z3480 Encounter for supervision of other normal pregnancy, unspecified trimester: Secondary | ICD-10-CM

## 2019-08-16 DIAGNOSIS — Z3483 Encounter for supervision of other normal pregnancy, third trimester: Secondary | ICD-10-CM

## 2019-08-16 DIAGNOSIS — Z3A33 33 weeks gestation of pregnancy: Secondary | ICD-10-CM

## 2019-08-16 HISTORY — DX: Personal history of other infectious and parasitic diseases: Z86.19

## 2019-08-16 NOTE — Progress Notes (Signed)
I connected with  Erika Bailey on 08/16/19 by a video enabled telemedicine application and verified that I am speaking with the correct person using two identifiers.   I discussed the limitations of evaluation and management by telemedicine. The patient expressed understanding and agreed to proceed.  MyChart OB, c/o leg cramps.

## 2019-08-16 NOTE — Progress Notes (Signed)
   OBSTETRICS PRENATAL VIRTUAL VISIT ENCOUNTER NOTE  Provider location: Center for [facility]   I connected with Erika Bailey on 08/16/19 at 11:15 AM EDT by MyChart Video Encounter at home and verified that I am speaking with the correct person using two identifiers.   I discussed the limitations, risks, security and privacy concerns of performing an evaluation and management service virtually and the availability of in person appointments. I also discussed with the patient that there may be a patient responsible charge related to this service. The patient expressed understanding and agreed to proceed. Subjective:  Erika Bailey is a 19 y.o. G2P0010 at 104w5d being seen today for ongoing prenatal care.  She is currently monitored for the following issues for this low-risk pregnancy and has Supervision of other normal pregnancy, antepartum and History of ELISA positive for HSV on their problem list.  Patient reports no complaints.  Contractions: Not present. Vag. Bleeding: None.  Movement: Present. Denies any leaking of fluid.   The following portions of the patient's history were reviewed and updated as appropriate: allergies, current medications, past family history, past medical history, past social history, past surgical history and problem list.   Objective:   Vitals:   08/16/19 1027  BP: 103/71  Pulse: 99    Fetal Status:     Movement: Present     General:  Alert, oriented and cooperative. Patient is in no acute distress.  Respiratory: Normal respiratory effort, no problems with respiration noted  Mental Status: Normal mood and affect. Normal behavior. Normal judgment and thought content.  Rest of physical exam deferred due to type of encounter  Imaging: No results found.  Assessment and Plan:  Pregnancy: G2P0010 at [redacted]w[redacted]d 1. Supervision of other normal pregnancy, antepartum Stable GBS next visit  2. History of ELISA positive for HSV Start  suppression at next OB visit  Preterm labor symptoms and general obstetric precautions including but not limited to vaginal bleeding, contractions, leaking of fluid and fetal movement were reviewed in detail with the patient. I discussed the assessment and treatment plan with the patient. The patient was provided an opportunity to ask questions and all were answered. The patient agreed with the plan and demonstrated an understanding of the instructions. The patient was advised to call back or seek an in-person office evaluation/go to MAU at [facility] for any urgent or concerning symptoms. Please refer to After Visit Summary for other counseling recommendations.   I provided 8 minutes of face-to-face time during this encounter.  Return in about 3 weeks (around 09/06/2019) for face to face, any provider.  Future Appointments  Date Time Provider Department Center  08/16/2019 11:15 AM Hermina Staggers, MD [facility] None    Hermina Staggers, MD Center for [facility], [facility]

## 2019-09-01 ENCOUNTER — Ambulatory Visit: Payer: Medicaid Other | Admitting: *Deleted

## 2019-09-01 ENCOUNTER — Other Ambulatory Visit (HOSPITAL_COMMUNITY)
Admission: RE | Admit: 2019-09-01 | Discharge: 2019-09-01 | Disposition: A | Discharge status: Home / Self Care | Payer: Medicaid Other | Source: Ambulatory Visit | Attending: Obstetrics | Admitting: Obstetrics

## 2019-09-01 ENCOUNTER — Other Ambulatory Visit: Payer: Self-pay

## 2019-09-01 DIAGNOSIS — Z3480 Encounter for supervision of other normal pregnancy, unspecified trimester: Secondary | ICD-10-CM

## 2019-09-01 DIAGNOSIS — N898 Other specified noninflammatory disorders of vagina: Secondary | ICD-10-CM

## 2019-09-01 NOTE — Progress Notes (Signed)
SUBJECTIVE:  19 y.o. female complains of vaginal itch and odor x 1 week.  Patient's last menstrual period was 12/23/2018 (exact date).  OBJECTIVE:  She appears well, afebrile.   ASSESSMENT:  Vaginal Discharge  Vaginal Odor   PLAN:  GC, chlamydia, trichomonas, BVAG, CVAG probe sent to lab. Treatment: To be determined once lab results are received ROV prn if symptoms persist or worsen.

## 2019-09-02 LAB — CERVICOVAGINAL ANCILLARY ONLY
Bacterial Vaginitis (gardnerella): POSITIVE — AB
Candida Glabrata: NEGATIVE
Candida Vaginitis: POSITIVE — AB
Chlamydia: NEGATIVE
Comment: NEGATIVE
Comment: NEGATIVE
Comment: NEGATIVE
Comment: NEGATIVE
Comment: NEGATIVE
Comment: NORMAL
Neisseria Gonorrhea: NEGATIVE
Trichomonas: NEGATIVE

## 2019-09-03 ENCOUNTER — Other Ambulatory Visit: Payer: Self-pay | Admitting: Obstetrics & Gynecology

## 2019-09-03 DIAGNOSIS — Z3480 Encounter for supervision of other normal pregnancy, unspecified trimester: Secondary | ICD-10-CM

## 2019-09-03 MED ORDER — FLUCONAZOLE 150 MG PO TABS: 150.0000 mg | ORAL_TABLET | Freq: Once | ORAL | 0 refills | Status: AC

## 2019-09-03 MED ORDER — METRONIDAZOLE 500 MG PO TABS: 500.0000 mg | ORAL_TABLET | Freq: Two times a day (BID) | ORAL | 0 refills | Status: AC

## 2019-09-03 NOTE — Progress Notes (Signed)
Meds ordered this encounter  Medications   fluconazole (DIFLUCAN) 150 MG tablet    Sig: Take 1 tablet (150 mg total) by mouth once for 1 dose.    Dispense:  1 tablet    Refill:  0   metroNIDAZOLE (FLAGYL) 500 MG tablet    Sig: Take 1 tablet (500 mg total) by mouth 2 (two) times daily for 7 days.    Dispense:  14 tablet    Refill:  0    

## 2019-09-06 ENCOUNTER — Ambulatory Visit (INDEPENDENT_AMBULATORY_CARE_PROVIDER_SITE_OTHER): Payer: Medicaid Other | Admitting: Advanced Practice Midwife

## 2019-09-06 ENCOUNTER — Other Ambulatory Visit (HOSPITAL_COMMUNITY)
Admission: RE | Admit: 2019-09-06 | Discharge: 2019-09-06 | Disposition: A | Discharge status: Home / Self Care | Payer: Medicaid Other | Source: Ambulatory Visit | Attending: Advanced Practice Midwife | Admitting: Advanced Practice Midwife

## 2019-09-06 ENCOUNTER — Other Ambulatory Visit: Payer: Self-pay

## 2019-09-06 VITALS — BP 115/73 | HR 110 | Wt 170.0 lb

## 2019-09-06 DIAGNOSIS — Z3480 Encounter for supervision of other normal pregnancy, unspecified trimester: Secondary | ICD-10-CM

## 2019-09-06 DIAGNOSIS — Z3A36 36 weeks gestation of pregnancy: Secondary | ICD-10-CM

## 2019-09-06 DIAGNOSIS — Z3483 Encounter for supervision of other normal pregnancy, third trimester: Secondary | ICD-10-CM

## 2019-09-06 DIAGNOSIS — Z124 Encounter for screening for malignant neoplasm of cervix: Secondary | ICD-10-CM

## 2019-09-06 DIAGNOSIS — Z202 Contact with and (suspected) exposure to infections with a predominantly sexual mode of transmission: Secondary | ICD-10-CM

## 2019-09-06 MED ORDER — VALACYCLOVIR HCL 500 MG PO TABS: 500.0000 mg | ORAL_TABLET | Freq: Two times a day (BID) | ORAL | 0 refills | Status: DC

## 2019-09-06 NOTE — Progress Notes (Signed)
   PRENATAL VISIT NOTE  Subjective:  Erika Bailey is a 19 y.o. G2P0010 at [redacted]w[redacted]d being seen today for ongoing prenatal care.  She is currently monitored for the following issues for this low-risk pregnancy and has Supervision of other normal pregnancy, antepartum and History of ELISA positive for HSV on their problem list.  Patient reports occasional contractions.  Contractions: Irregular. Vag. Bleeding: None.  Movement: Present. Denies leaking of fluid.   The following portions of the patient's history were reviewed and updated as appropriate: allergies, current medications, past family history, past medical history, past social history, past surgical history and problem list.   Objective:   Vitals:   09/06/19 1343  BP: 115/73  Pulse: (!) 110  Weight: 170 lb (77.1 kg)    Fetal Status: Fetal Heart Rate (bpm): 138 Fundal Height: 36 cm Movement: Present  Presentation: Vertex  General:  Alert, oriented and cooperative. Patient is in no acute distress.  Skin: Skin is warm and dry. No rash noted.   Cardiovascular: Normal heart rate noted  Respiratory: Normal respiratory effort, no problems with respiration noted  Abdomen: Soft, gravid, appropriate for gestational age.  Pain/Pressure: Present     Pelvic: Cervical exam deferred Dilation: 1 Effacement (%): 50 Station: -3  Extremities: Normal range of motion.  Edema: None  Mental Status: Normal mood and affect. Normal behavior. Normal judgment and thought content.   Assessment and Plan:  Pregnancy: G2P0010 at [redacted]w[redacted]d 1. Supervision of other normal pregnancy, antepartum --Anticipatory guidance about next visits/weeks of pregnancy given. --Next visit in 2 weeks, virtual as preferred by pt  2. Encounter for screening for cervical cancer  --Pt too young for pap at initial OB visit, done today  3. Exposure to genital herpes --Hx ELISA positive/partner exposure to HSV. Discussed with pt prenatally and she desires to prevent with  suppressive therapy at 36 weeks. - valACYclovir (VALTREX) 500 MG tablet; Take 1 tablet (500 mg total) by mouth 2 (two) times daily.  Dispense: 60 tablet; Refill: 0    Term labor symptoms and general obstetric precautions including but not limited to vaginal bleeding, contractions, leaking of fluid and fetal movement were reviewed in detail with the patient. Please refer to After Visit Summary for other counseling recommendations.   Return in about 2 weeks (around 09/20/2019).  Future Appointments  Date Time Provider Department Center  09/20/2019 11:15 AM Brock Bad, MD [facility] None    Sharen Counter, CNM

## 2019-09-06 NOTE — Progress Notes (Signed)
Patient reports fetal movement with irregular contractions. 

## 2019-09-07 LAB — CYTOLOGY - PAP
Comment: NEGATIVE
Diagnosis: NEGATIVE
High risk HPV: NEGATIVE

## 2019-09-20 ENCOUNTER — Telehealth (INDEPENDENT_AMBULATORY_CARE_PROVIDER_SITE_OTHER): Payer: Medicaid Other | Admitting: Obstetrics

## 2019-09-20 ENCOUNTER — Encounter: Payer: Self-pay | Admitting: Obstetrics

## 2019-09-20 ENCOUNTER — Other Ambulatory Visit: Payer: Self-pay

## 2019-09-20 VITALS — BP 113/55 | HR 76

## 2019-09-20 DIAGNOSIS — N760 Acute vaginitis: Secondary | ICD-10-CM

## 2019-09-20 DIAGNOSIS — Z3483 Encounter for supervision of other normal pregnancy, third trimester: Secondary | ICD-10-CM

## 2019-09-20 DIAGNOSIS — Z202 Contact with and (suspected) exposure to infections with a predominantly sexual mode of transmission: Secondary | ICD-10-CM

## 2019-09-20 DIAGNOSIS — Z8619 Personal history of other infectious and parasitic diseases: Secondary | ICD-10-CM

## 2019-09-20 DIAGNOSIS — O98211 Gonorrhea complicating pregnancy, first trimester: Secondary | ICD-10-CM

## 2019-09-20 DIAGNOSIS — Z3480 Encounter for supervision of other normal pregnancy, unspecified trimester: Secondary | ICD-10-CM

## 2019-09-20 DIAGNOSIS — Z3A38 38 weeks gestation of pregnancy: Secondary | ICD-10-CM

## 2019-09-20 MED ORDER — TINIDAZOLE 500 MG PO TABS: 1000.0000 mg | ORAL_TABLET | Freq: Every day | ORAL | 2 refills | Status: DC

## 2019-09-20 NOTE — Progress Notes (Signed)
S/w pt for virtual visit. Pt reports fetal movement, denies contractions

## 2019-09-20 NOTE — Addendum Note (Signed)
Addended by: Brock Bad on: 09/20/2019 05:35 PM   Modules accepted: Orders

## 2019-09-20 NOTE — Progress Notes (Addendum)
   OBSTETRICS PRENATAL VIRTUAL VISIT ENCOUNTER NOTE  Provider location: Center for [facility]   I connected with [name] on 09/20/19 at 11:15 AM EDT by MyChart Video Encounter at home and verified that I am speaking with the correct person using two identifiers.   I discussed the limitations, risks, security and privacy concerns of performing an evaluation and management service virtually and the availability of in person appointments. I also discussed with the patient that there may be a patient responsible charge related to this service. The patient expressed understanding and agreed to proceed. Subjective:  Erika Bailey is a 20 y.o. G2P0010 at [redacted]w[redacted]d being seen today for ongoing prenatal care.  She is currently monitored for the following issues for this low-risk pregnancy and has Supervision of other normal pregnancy, antepartum and History of ELISA positive for HSV on their problem list.  Patient reports no complaints.  Contractions: Not present. Vag. Bleeding: None.  Movement: Present. Denies any leaking of fluid.   The following portions of the patient's history were reviewed and updated as appropriate: allergies, current medications, past family history, past medical history, past social history, past surgical history and problem list.   Objective:   Vitals:   09/20/19 1123  BP: (!) 113/55  Pulse: 76    Fetal Status:     Movement: Present     General:  Alert, oriented and cooperative. Patient is in no acute distress.  Respiratory: Normal respiratory effort, no problems with respiration noted  Mental Status: Normal mood and affect. Normal behavior. Normal judgment and thought content.  Rest of physical exam deferred due to type of encounter  Imaging: No results found.  Assessment and Plan:  Pregnancy: G2P0010 at [redacted]w[redacted]d 1. Supervision of other normal pregnancy, antepartum  2. History of ELISA positive for HSV - clinically stable  3.  Gonorrhea affecting pregnancy in first trimester - treated with negative TOC   Term labor symptoms and general obstetric precautions including but not limited to vaginal bleeding, contractions, leaking of fluid and fetal movement were reviewed in detail with the patient. I discussed the assessment and treatment plan with the patient. The patient was provided an opportunity to ask questions and all were answered. The patient agreed with the plan and demonstrated an understanding of the instructions. The patient was advised to call back or seek an in-person office evaluation/go to MAU at [facility] for any urgent or concerning symptoms. Please refer to After Visit Summary for other counseling recommendations.   I provided 10 minutes of face-to-face time during this encounter.  Return in about 1 week (around 09/27/2019) for ROB.  Coral Ceo, MD Center for [facility], [facility] 09/20/19

## 2019-09-27 NOTE — Telephone Encounter (Signed)
Please review for refill.  

## 2019-09-28 ENCOUNTER — Other Ambulatory Visit (HOSPITAL_COMMUNITY)
Admission: RE | Admit: 2019-09-28 | Discharge: 2019-09-28 | Disposition: A | Discharge status: Home / Self Care | Payer: Medicaid Other | Source: Ambulatory Visit | Attending: Obstetrics and Gynecology | Admitting: Obstetrics and Gynecology

## 2019-09-28 ENCOUNTER — Encounter (HOSPITAL_COMMUNITY): Payer: Self-pay | Admitting: *Deleted

## 2019-09-28 ENCOUNTER — Encounter: Payer: Self-pay | Admitting: Obstetrics and Gynecology

## 2019-09-28 ENCOUNTER — Other Ambulatory Visit: Payer: Self-pay | Admitting: Obstetrics and Gynecology

## 2019-09-28 ENCOUNTER — Other Ambulatory Visit: Payer: Self-pay

## 2019-09-28 ENCOUNTER — Ambulatory Visit (INDEPENDENT_AMBULATORY_CARE_PROVIDER_SITE_OTHER): Payer: Medicaid Other | Admitting: Obstetrics and Gynecology

## 2019-09-28 ENCOUNTER — Telehealth (HOSPITAL_COMMUNITY): Payer: Self-pay | Admitting: *Deleted

## 2019-09-28 VITALS — BP 109/72 | HR 84 | Wt 174.6 lb

## 2019-09-28 DIAGNOSIS — Z3480 Encounter for supervision of other normal pregnancy, unspecified trimester: Secondary | ICD-10-CM

## 2019-09-28 DIAGNOSIS — O98813 Other maternal infectious and parasitic diseases complicating pregnancy, third trimester: Secondary | ICD-10-CM

## 2019-09-28 DIAGNOSIS — B3731 Acute candidiasis of vulva and vagina: Secondary | ICD-10-CM

## 2019-09-28 DIAGNOSIS — B373 Candidiasis of vulva and vagina: Secondary | ICD-10-CM

## 2019-09-28 DIAGNOSIS — Z3A39 39 weeks gestation of pregnancy: Secondary | ICD-10-CM

## 2019-09-28 MED ORDER — TERCONAZOLE 0.4 % VA CREA: 1.0000 | TOPICAL_CREAM | Freq: Every day | VAGINAL | 0 refills | Status: DC

## 2019-09-28 NOTE — Patient Instructions (Signed)

## 2019-09-28 NOTE — Progress Notes (Signed)
   PRENATAL VISIT NOTE  Subjective:  Erika Bailey is a 19 y.o. G2P0010 at [redacted]w[redacted]d being seen today for ongoing prenatal care.  She is currently monitored for the following issues for this low-risk pregnancy and has Supervision of other normal pregnancy, antepartum and History of ELISA positive for HSV on their problem list.  Patient reports vaginal irritation.  Contractions: Not present. Vag. Bleeding: None.  Movement: Present. Denies leaking of fluid.   The following portions of the patient's history were reviewed and updated as appropriate: allergies, current medications, past family history, past medical history, past social history, past surgical history and problem list.   Objective:   Vitals:   09/28/19 1440  BP: 109/72  Pulse: 84  Weight: 174 lb 9.6 oz (79.2 kg)    Fetal Status: Fetal Heart Rate (bpm): 140 Fundal Height: 40 cm Movement: Present  Presentation: Vertex  General:  Alert, oriented and cooperative. Patient is in no acute distress.  Skin: Skin is warm and dry. No rash noted.   Cardiovascular: Normal heart rate noted  Respiratory: Normal respiratory effort, no problems with respiration noted  Abdomen: Soft, gravid, appropriate for gestational age.  Pain/Pressure: Absent     Pelvic: tender, irritated Dilation: Fingertip Effacement (%): 50 Station: -3  Extremities: Normal range of motion.  Edema: None  Mental Status: Normal mood and affect. Normal behavior. Normal judgment and thought content.   Assessment and Plan:  Pregnancy: G2P0010 at [redacted]w[redacted]d 1. Supervision of other normal pregnancy, antepartum  - Strep Gp B NAA - Cervicovaginal ancillary only( [place])  2. Vaginal candidiasis  - terconazole (TERAZOL 7) 0.4 % vaginal cream; Place 1 applicator vaginally at bedtime.  Dispense: 45 g; Refill: 0  Term labor symptoms and general obstetric precautions including but not limited to vaginal bleeding, contractions, leaking of fluid and fetal movement were reviewed  in detail with the patient. Please refer to After Visit Summary for other counseling recommendations.   Return if symptoms worsen or fail to improve, for Postdates induction next week..  Future Appointments  Date Time Provider Department Center  10/06/2019  7:50 AM [facility] SCHED ROOM [facility] None    Johnny Bridge, MD

## 2019-09-28 NOTE — Telephone Encounter (Signed)
Preadmission screen  

## 2019-09-28 NOTE — Progress Notes (Signed)
Patient reports fetal movement, denies contractions. Pt states that she thinks she still has a yeast infection.

## 2019-09-29 ENCOUNTER — Other Ambulatory Visit (HOSPITAL_COMMUNITY): Payer: Self-pay | Admitting: Family Medicine

## 2019-09-29 LAB — CERVICOVAGINAL ANCILLARY ONLY
Chlamydia: NEGATIVE
Comment: NEGATIVE
Comment: NORMAL
Neisseria Gonorrhea: NEGATIVE

## 2019-09-29 MED ORDER — VALACYCLOVIR HCL 500 MG PO TABS: 500.0000 mg | ORAL_TABLET | Freq: Two times a day (BID) | ORAL | 0 refills | Status: DC

## 2019-09-29 NOTE — Progress Notes (Signed)
Patient ID: ANNSLEY AKKERMAN, female   DOB: Jun 12, 2000, 19 y.o.   MRN: [id number] Patient was assessed and managed by nursing staff during this encounter. I have reviewed the chart and agree with the documentation and plan. I have also made any necessary editorial changes.  Scheryl Darter, MD 09/29/2019 1:18 PM

## 2019-09-30 ENCOUNTER — Encounter: Payer: Self-pay | Admitting: Obstetrics & Gynecology

## 2019-09-30 DIAGNOSIS — O99820 Streptococcus B carrier state complicating pregnancy: Secondary | ICD-10-CM | POA: Insufficient documentation

## 2019-09-30 LAB — STREP GP B NAA: Strep Gp B NAA: POSITIVE — AB

## 2019-10-02 ENCOUNTER — Other Ambulatory Visit: Payer: Self-pay | Admitting: Family Medicine

## 2019-10-04 ENCOUNTER — Other Ambulatory Visit (HOSPITAL_COMMUNITY): Payer: Medicaid Other | Attending: Family Medicine

## 2019-10-06 ENCOUNTER — Other Ambulatory Visit: Payer: Self-pay

## 2019-10-06 ENCOUNTER — Inpatient Hospital Stay (HOSPITAL_COMMUNITY): Payer: Medicaid Other

## 2019-10-06 ENCOUNTER — Inpatient Hospital Stay (HOSPITAL_COMMUNITY)
Admission: AD | Admit: 2019-10-06 | Discharge: 2019-10-09 | DRG: 806 | Disposition: A | Discharge status: Home / Self Care | Payer: Medicaid Other | Attending: Obstetrics and Gynecology | Admitting: Obstetrics and Gynecology

## 2019-10-06 ENCOUNTER — Encounter (HOSPITAL_COMMUNITY): Payer: Self-pay | Admitting: Family Medicine

## 2019-10-06 DIAGNOSIS — O480 Post-term pregnancy: Principal | ICD-10-CM | POA: Diagnosis present

## 2019-10-06 DIAGNOSIS — Z8619 Personal history of other infectious and parasitic diseases: Secondary | ICD-10-CM | POA: Diagnosis present

## 2019-10-06 DIAGNOSIS — Z3480 Encounter for supervision of other normal pregnancy, unspecified trimester: Secondary | ICD-10-CM

## 2019-10-06 DIAGNOSIS — Z3490 Encounter for supervision of normal pregnancy, unspecified, unspecified trimester: Secondary | ICD-10-CM | POA: Diagnosis present

## 2019-10-06 DIAGNOSIS — O99820 Streptococcus B carrier state complicating pregnancy: Secondary | ICD-10-CM

## 2019-10-06 DIAGNOSIS — Z87891 Personal history of nicotine dependence: Secondary | ICD-10-CM

## 2019-10-06 DIAGNOSIS — O9832 Other infections with a predominantly sexual mode of transmission complicating childbirth: Secondary | ICD-10-CM | POA: Diagnosis not present

## 2019-10-06 DIAGNOSIS — Z20822 Contact with and (suspected) exposure to covid-19: Secondary | ICD-10-CM | POA: Diagnosis not present

## 2019-10-06 DIAGNOSIS — A6000 Herpesviral infection of urogenital system, unspecified: Secondary | ICD-10-CM | POA: Diagnosis not present

## 2019-10-06 DIAGNOSIS — Z3A41 41 weeks gestation of pregnancy: Secondary | ICD-10-CM

## 2019-10-06 DIAGNOSIS — O99824 Streptococcus B carrier state complicating childbirth: Secondary | ICD-10-CM | POA: Diagnosis present

## 2019-10-06 DIAGNOSIS — B951 Streptococcus, group B, as the cause of diseases classified elsewhere: Secondary | ICD-10-CM | POA: Diagnosis not present

## 2019-10-06 LAB — RPR: RPR Ser Ql: NONREACTIVE

## 2019-10-06 LAB — TYPE AND SCREEN
ABO/RH(D): A POS
Antibody Screen: NEGATIVE

## 2019-10-06 LAB — SARS CORONAVIRUS 2 BY RT PCR (DIASORIN): SARS Coronavirus 2: NEGATIVE

## 2019-10-06 LAB — CBC
HCT: 40.3 % (ref 36.0–46.0)
Hemoglobin: 12.8 g/dL (ref 12.0–15.0)
MCH: 27.6 pg (ref 26.0–34.0)
MCHC: 31.8 g/dL (ref 30.0–36.0)
MCV: 87 fL (ref 80.0–100.0)
Platelets: 245 10*3/uL (ref 150–400)
RBC: 4.63 MIL/uL (ref 3.87–5.11)
RDW: 15 % (ref 11.5–15.5)
WBC: 7.6 10*3/uL (ref 4.0–10.5)
nRBC: 0 % (ref 0.0–0.2)

## 2019-10-06 MED ORDER — OXYCODONE-ACETAMINOPHEN 5-325 MG PO TABS: 1.0000 | ORAL_TABLET | ORAL | Status: DC | PRN

## 2019-10-06 MED ORDER — ONDANSETRON HCL 4 MG/2ML IJ SOLN
4.0000 mg | Freq: Four times a day (QID) | INTRAMUSCULAR | Status: DC | PRN
  Administered 2019-10-07: 4 mg via INTRAVENOUS
  Filled 2019-10-06: qty 2

## 2019-10-06 MED ORDER — SODIUM CHLORIDE 0.9 % IV SOLN
5.0000 10*6.[IU] | Freq: Once | INTRAVENOUS | Status: AC
  Administered 2019-10-06: 5 10*6.[IU] via INTRAVENOUS
  Filled 2019-10-06: qty 5

## 2019-10-06 MED ORDER — LIDOCAINE HCL (PF) 1 % IJ SOLN: 30.0000 mL | INTRAMUSCULAR | Status: DC | PRN

## 2019-10-06 MED ORDER — FENTANYL CITRATE (PF) 100 MCG/2ML IJ SOLN
50.0000 ug | INTRAMUSCULAR | Status: DC | PRN
  Filled 2019-10-06 (×2): qty 2

## 2019-10-06 MED ORDER — OXYTOCIN-SODIUM CHLORIDE 30-0.9 UT/500ML-% IV SOLN
2.5000 [IU]/h | INTRAVENOUS | Status: DC
  Filled 2019-10-06: qty 500

## 2019-10-06 MED ORDER — TERBUTALINE SULFATE 1 MG/ML IJ SOLN: 0.2500 mg | Freq: Once | INTRAMUSCULAR | Status: DC | PRN

## 2019-10-06 MED ORDER — OXYTOCIN BOLUS FROM INFUSION
333.0000 mL | Freq: Once | INTRAVENOUS | Status: AC
  Administered 2019-10-07: 333 mL via INTRAVENOUS

## 2019-10-06 MED ORDER — OXYTOCIN-SODIUM CHLORIDE 30-0.9 UT/500ML-% IV SOLN
1.0000 m[IU]/min | INTRAVENOUS | Status: DC
  Administered 2019-10-06: 2 m[IU]/min via INTRAVENOUS
  Administered 2019-10-06: 10 m[IU]/min via INTRAVENOUS
  Administered 2019-10-06: 6 m[IU]/min via INTRAVENOUS

## 2019-10-06 MED ORDER — OXYCODONE-ACETAMINOPHEN 5-325 MG PO TABS: 2.0000 | ORAL_TABLET | ORAL | Status: DC | PRN

## 2019-10-06 MED ORDER — ACETAMINOPHEN 325 MG PO TABS: 650.0000 mg | ORAL_TABLET | ORAL | Status: DC | PRN

## 2019-10-06 MED ORDER — LACTATED RINGERS IV SOLN: INTRAVENOUS | Status: DC

## 2019-10-06 MED ORDER — SOD CITRATE-CITRIC ACID 500-334 MG/5ML PO SOLN: 30.0000 mL | ORAL | Status: DC | PRN

## 2019-10-06 MED ORDER — VALACYCLOVIR HCL 500 MG PO TABS
500.0000 mg | ORAL_TABLET | Freq: Two times a day (BID) | ORAL | Status: DC
  Administered 2019-10-06 (×2): 500 mg via ORAL
  Filled 2019-10-06 (×2): qty 1

## 2019-10-06 MED ORDER — MISOPROSTOL 50MCG HALF TABLET
50.0000 ug | ORAL_TABLET | ORAL | Status: DC | PRN
  Administered 2019-10-06: 50 ug via BUCCAL
  Filled 2019-10-06 (×2): qty 1

## 2019-10-06 MED ORDER — PENICILLIN G POT IN DEXTROSE 60000 UNIT/ML IV SOLN
3.0000 10*6.[IU] | INTRAVENOUS | Status: DC
  Administered 2019-10-06 – 2019-10-07 (×5): 3 10*6.[IU] via INTRAVENOUS
  Filled 2019-10-06 (×5): qty 50

## 2019-10-06 MED ORDER — FENTANYL CITRATE (PF) 100 MCG/2ML IJ SOLN
100.0000 ug | INTRAMUSCULAR | Status: DC | PRN
  Administered 2019-10-06: 100 ug via INTRAVENOUS
  Administered 2019-10-07: 15 ug via INTRAVENOUS
  Administered 2019-10-07: 100 ug via INTRAVENOUS
  Administered 2019-10-07: 85 ug via INTRAVENOUS
  Filled 2019-10-06: qty 2

## 2019-10-06 MED ORDER — LACTATED RINGERS IV SOLN: 500.0000 mL | INTRAVENOUS | Status: DC | PRN

## 2019-10-06 NOTE — Progress Notes (Signed)
Wendell.Pavlov MD explained procedure for foley bulb placemne

## 2019-10-06 NOTE — Progress Notes (Signed)
Labor Progress Note Erika Bailey is a 19 y.o. G2P0010 at 55w0dpresented for postdates induction of labor  S: Met patient and family. Feeling ctx, mildly.   O:  BP 106/66   Pulse 75   Temp 98.7 F (37.1 C) (Oral)   Resp 16   Ht '5\' 6"'  (1.676 m)   Wt 79.6 kg   LMP 12/23/2018 (Exact Date)   BMI 28.31 kg/m  EFM: 140/mod/+accels, multiple. No decels  CVE: Dilation: 1.5 Effacement (%): Thick Station: -2 Exam by:: Dr NErnestina Patches  Placed FB and inflated to 60cc  A&P: 19y.o. G2P0010 429w0dor PD IOL #Labor: Progressing well. FB placed, will hold off on cytotec. Cervix is very soft #Pain: PRN #FWB: Cat I. OK for intermittent monitoring, ordered #GBS positive, PCN started  KiCaren MacadamMD 2:21 PM

## 2019-10-06 NOTE — Progress Notes (Signed)
Erika Bailey is a 19 y.o. G2P0010 at [redacted]w[redacted]d admitted for induction of labor due to post-dates.  Subjective: Patient is feeling her contractions a little bit more, but they are manageable. Reports good fetal movement.  Objective: BP 106/66   Pulse 75   Temp 98.7 F (37.1 C) (Oral)   Resp 16   Ht 5\' 6"  (1.676 m)   Wt 79.6 kg   LMP 12/23/2018 (Exact Date)   BMI 28.31 kg/m  No intake/output data recorded.  FHT:  FHR: 135 bpm, variability: moderate,  accelerations:  Present,  decelerations:  Absent UC:   regular, every 1-3 minutes  SVE:   Dilation: 1.5 Effacement (%): Thick Station: -2 Exam by:: Dr [email or web address]    Labs: Lab Results  Component Value Date   WBC 7.6 10/06/2019   HGB 12.8 10/06/2019   HCT 40.3 10/06/2019   MCV 87.0 10/06/2019   PLT 245 10/06/2019    Assessment / Plan: Erika Bailey is a 19 y.o. G2P0010 at [redacted]w[redacted]d here for IOL for post-dates.  #Labor: Cytotec x1 at [id number]. FB placed at this exam. Pitocin PRN. AROM PRN. Anticipating vaginal delivery. #Pain:  IV pain meds PRN, epidural upon request #FWB: Cat 1 #GBS/ID: positive--PCN #COVID: Negative #MOF: Breast #MOC: Undecided--wants to discuss after delivery #Circ: Yes #Hx of ELISA positive HSV: Valtrex ordered   Zaylie Gisler DO PGY1, Family Medicine Resident 10/06/2019, 2:19 PM

## 2019-10-06 NOTE — Progress Notes (Signed)
Labor Progress Note Erika Bailey is a 19 y.o. G2P0010 at [redacted]w[redacted]d presented for postdates induction of labor  S: Comfortable and walking around in the room.   O:  BP 111/67   Pulse 80   Temp 98.7 F (37.1 C) (Oral)   Resp 16   Ht 5\' 6"  (1.676 m)   Wt 79.6 kg   LMP 12/23/2018 (Exact Date)   BMI 28.31 kg/m   FHT: FHR 135 bpm, Moderate variability, + Accels, - Decels TOCO: every 2-3 mins  CVE: Dilation: 4.5 Effacement (%): 50 Station: -3 Presentation: Vertex Exam by:: Dr. [email or web address]   A&P: 19 y.o. G2P0010 [redacted]w[redacted]d for PD IOL #Labor: Progressing well. S/p FB and cytotec. Pitiocin started at 1800, continue to titrate  #Pain: per patient request #FWB: Category I #GBS positive, PCN started  Anticipate vaginal delivery  [redacted]w[redacted]d, DO 8:55 PM

## 2019-10-06 NOTE — H&P (Signed)
LABOR AND DELIVERY ADMISSION HISTORY AND PHYSICAL NOTE  Erika Bailey is a 19 y.o. female G2P0010 with IUP at 74w0dby LMP c/w 268w5dSKorearesenting for IOL for post-dates.   She reports positive fetal movement. She denies leakage of fluid, vaginal bleeding, or contractions.   She plans on breast feeding. Her contraception plan is: undecided.  [facility] , CWD, polydactyly, cephalic presentation, anterior placenta, 67%ile, EFW [id number] complications:  - Hx of ELSIA positive for HSV  Past Medical History: No past medical history on file.  Past Surgical History: Past Surgical History:  Procedure Laterality Date  . DILATION AND EVACUATION N/A 01/21/2018   Procedure: DILATATION AND EVACUATION;  Surgeon: ArWoodroe ModeMD;  [facility];  Service: Gynecology;  Laterality: N/A;  . NO PAST SURGERIES      Obstetrical History: OB History    Gravida  2   Para      Term      Preterm      AB  1   Living  0     SAB  1   TAB      Ectopic      Multiple      Live Births              Social History: Social History   Socioeconomic History  . Marital status: Single    Spouse name: Not on file  . Number of children: Not on file  . Years of education: Not on file  . Highest education level: Not on file  Occupational History  . Occupation: AmBassettTobacco Use  . Smoking status: Former Smoker    Quit date: 01/24/2019    Years since quitting: 0.6  . Smokeless tobacco: Never Used  Vaping Use  . Vaping Use: Never used  Substance and Sexual Activity  . Alcohol use: Not Currently  . Drug use: Never  . Sexual activity: Yes    Birth control/protection: None  Other Topics Concern  . Not on file  Social History Narrative  . Not on file   Social Determinants of Health   Financial Resource Strain:   . Difficulty of Paying Living Expenses:   Food Insecurity:   . Worried About RuCharity fundraisern  the Last Year:   . RaArboriculturistn the Last Year:   Transportation Needs:   . LaFilm/video editorMedical):   . Marland Kitchenack of Transportation (Non-Medical):   Physical Activity:   . Days of Exercise per Week:   . Minutes of Exercise per Session:   Stress:   . Feeling of Stress :   Social Connections:   . Frequency of Communication with Friends and Family:   . Frequency of Social Gatherings with Friends and Family:   . Attends Religious Services:   . Active Member of Clubs or Organizations:   . Attends ClArchivisteetings:   . Marland Kitchenarital Status:     Family History: Family History  Problem Relation Age of Onset  . Healthy Mother   . Healthy Father   . Cancer Maternal Grandmother     Allergies: Allergies  Allergen Reactions  . Peanuts [Peanut Oil] Itching and Swelling    Medications Prior to Admission  Medication Sig Dispense Refill Last Dose  . Blood Pressure Monitoring (BLOOD PRESSURE KIT) DEVI 1 kit by Does not apply route once a week. Check BP regularly and record reading into the Babyscripts  App. Large Cuff. DX O90.0 1 each 0   . Elastic Bandages & Supports (COMFORT FIT MATERNITY SUPP SM) MISC Wear as directed. (Patient not taking: Reported on 09/20/2019) 1 each 0   . Prenatal Vit-Fe Fumarate-FA (PREPLUS) 27-1 MG TABS Take 1 tablet by mouth daily. 30 tablet 13   . terconazole (TERAZOL 7) 0.4 % vaginal cream Place 1 applicator vaginally at bedtime. 45 g 0   . tinidazole (TINDAMAX) 500 MG tablet Take 2 tablets (1,000 mg total) by mouth daily with breakfast. (Patient not taking: Reported on 09/28/2019) 10 tablet 2   . valACYclovir (VALTREX) 500 MG tablet Take 1 tablet (500 mg total) by mouth 2 (two) times daily. 60 tablet 0      Review of Systems  All systems reviewed and negative except as stated in HPI  Physical Exam Blood pressure 112/67, pulse 71, temperature 98.8 F (37.1 C), temperature source Oral, resp. rate 16, height '5\' 6"'  (1.676 m), weight 79.6 kg, last  menstrual period 12/23/2018, unknown if currently breastfeeding. General appearance: alert, oriented, NAD Lungs: normal respiratory effort Heart: regular rate Abdomen: soft, non-tender; gravid Extremities: No calf swelling or tenderness Presentation: cephalic by SVE (confirmed by Dr. Juleen China)  Fetal monitoringBaseline: 135 bpm, Variability: Good {> 6 bpm), Accelerations: Reactive and Decelerations: Absent Uterine activity: Irregular every 6-8 minutes  Dilation: Fingertip Effacement (%): Thick Station: Ballotable Exam by:: Dr Hughes Better  Prenatal labs: ABO, Rh: --/--/PENDING (07/28 0757) Antibody: PENDING (07/28 0757) Rubella: 11.80 (01/04 1417) RPR: Non Reactive (04/26 0950)  HBsAg: Negative (01/04 1417)  HIV: Non Reactive (04/26 0950)  GC/Chlamydia: Negative (6/23)  GBS: Positive/-- (07/20 0325)  2-hr GTT: 77-95-56 Genetic screening:  Not completed Anatomy US: Suspected polydactyly  Prenatal Transfer Tool  Maternal Diabetes: No Genetic Screening: Declined Maternal Ultrasounds/Referrals: Normal Fetal Ultrasounds or other Referrals:  None Maternal Substance Abuse:  No Significant Maternal Medications:  None Significant Maternal Lab Results: Group B Strep positive  Results for orders placed or performed during the hospital encounter of 10/06/19 (from the past 24 hour(s))  Type and screen   Collection Time: 10/06/19  7:57 AM  Result Value Ref Range   ABO/RH(D) PENDING    Antibody Screen PENDING    Sample Expiration      10/09/2019,2359 Performed at [facility], [address], [place], [place] [address]     Patient Active Problem List   Diagnosis Date Noted  . Encounter for induction of labor 10/06/2019  . Group B Streptococcus carrier, +RV culture, currently pregnant 09/30/2019  . 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 Supervision of other normal pregnancy, antepartum 02/10/2019    Assessment: Erika Bailey is a 19 y.o. G2P0010 at 20w0dhere  for IOL for post-dates.  #Labor: IOL, cervix currently unfavorable, giving Cytotec x1 at this exam. Pitocin PRN. AROM PRN. Anticipate vaginal delivery. #Pain: IV pain meds PRN, epidural upon request #FWB: Cat 1 #GBS/ID: positive--PCN #COVID: swab pending #MOF: Breast #MOC: Undecided--wants to discuss after delivery #Circ: Yes #Hx of ELISA positive HSV: Valtrex ordered   ARise Patience PGY 1 Family Medicine 10/06/2019, 8:59 AM

## 2019-10-06 NOTE — Progress Notes (Signed)
1410 Pt gave verbal consent to foley bulb placement

## 2019-10-06 NOTE — Progress Notes (Signed)
Labor Progress Note JALEYAH LONGHI is a 19 y.o. G2P0010 at [redacted]w[redacted]d presented for postdates induction of labor  S: FB out, reports ctx 6 out of 10 currently  O:  BP 116/75   Pulse 79   Temp 98.7 F (37.1 C) (Oral)   Resp 16   Ht 5\' 6"  (1.676 m)   Wt 79.6 kg   LMP 12/23/2018 (Exact Date)   BMI 28.31 kg/m  EFM: 140/mod/+accels, multiple. No decels  CVE: Dilation: 4.5 Effacement (%): 50 Station: -3 Presentation: Vertex Exam by:: Dr. [email or web address]   A&P: 19 y.o. G2P0010 [redacted]w[redacted]d for PD IOL #Labor: Progressing well. S/p FB and cytotec. Cervix is very soft. Will start pitocin now. Patient declines having dinner first and would like to start pitocin. Reviewed normal labor curve and progress.  #Pain: PRN IV or epidural. Encouraged movement #FWB: Cat I- continuous monitoring given starting pitocin #GBS positive, PCN started  [redacted]w[redacted]d, MD 6:05 PM

## 2019-10-07 ENCOUNTER — Inpatient Hospital Stay (HOSPITAL_COMMUNITY): Payer: Medicaid Other | Admitting: Anesthesiology

## 2019-10-07 ENCOUNTER — Encounter (HOSPITAL_COMMUNITY): Payer: Self-pay | Admitting: Family Medicine

## 2019-10-07 DIAGNOSIS — O480 Post-term pregnancy: Secondary | ICD-10-CM

## 2019-10-07 DIAGNOSIS — Z3A41 41 weeks gestation of pregnancy: Secondary | ICD-10-CM

## 2019-10-07 DIAGNOSIS — O99824 Streptococcus B carrier state complicating childbirth: Secondary | ICD-10-CM

## 2019-10-07 DIAGNOSIS — B951 Streptococcus, group B, as the cause of diseases classified elsewhere: Secondary | ICD-10-CM

## 2019-10-07 MED ORDER — IBUPROFEN 600 MG PO TABS
600.0000 mg | ORAL_TABLET | Freq: Three times a day (TID) | ORAL | Status: DC | PRN
  Administered 2019-10-07 – 2019-10-08 (×4): 600 mg via ORAL
  Filled 2019-10-07 (×4): qty 1

## 2019-10-07 MED ORDER — PHENYLEPHRINE 40 MCG/ML (10ML) SYRINGE FOR IV PUSH (FOR BLOOD PRESSURE SUPPORT)
80.0000 ug | PREFILLED_SYRINGE | INTRAVENOUS | Status: DC | PRN
  Administered 2019-10-07 (×2): 80 ug via INTRAVENOUS

## 2019-10-07 MED ORDER — PHENYLEPHRINE 40 MCG/ML (10ML) SYRINGE FOR IV PUSH (FOR BLOOD PRESSURE SUPPORT)
80.0000 ug | PREFILLED_SYRINGE | INTRAVENOUS | Status: DC | PRN
  Filled 2019-10-07: qty 10

## 2019-10-07 MED ORDER — ONDANSETRON HCL 4 MG PO TABS: 4.0000 mg | ORAL_TABLET | ORAL | Status: DC | PRN

## 2019-10-07 MED ORDER — SODIUM CHLORIDE (PF) 0.9 % IJ SOLN
INTRAMUSCULAR | Status: DC | PRN
  Administered 2019-10-07: 12 mL/h via EPIDURAL

## 2019-10-07 MED ORDER — LIDOCAINE HCL (PF) 1 % IJ SOLN
INTRAMUSCULAR | Status: DC | PRN
  Administered 2019-10-07: 10 mL via EPIDURAL
  Administered 2019-10-07: 2 mL via EPIDURAL

## 2019-10-07 MED ORDER — DIBUCAINE (PERIANAL) 1 % EX OINT
1.0000 "application " | TOPICAL_OINTMENT | CUTANEOUS | Status: DC | PRN
  Administered 2019-10-08: 1 via RECTAL
  Filled 2019-10-07: qty 28

## 2019-10-07 MED ORDER — MEASLES, MUMPS & RUBELLA VAC IJ SOLR: 0.5000 mL | Freq: Once | INTRAMUSCULAR | Status: DC

## 2019-10-07 MED ORDER — TETANUS-DIPHTH-ACELL PERTUSSIS 5-2.5-18.5 LF-MCG/0.5 IM SUSP: 0.5000 mL | Freq: Once | INTRAMUSCULAR | Status: DC

## 2019-10-07 MED ORDER — FENTANYL-BUPIVACAINE-NACL 0.5-0.125-0.9 MG/250ML-% EP SOLN: 12.0000 mL/h | EPIDURAL | Status: DC | PRN

## 2019-10-07 MED ORDER — DIPHENHYDRAMINE HCL 25 MG PO CAPS: 25.0000 mg | ORAL_CAPSULE | Freq: Four times a day (QID) | ORAL | Status: DC | PRN

## 2019-10-07 MED ORDER — PRENATAL MULTIVITAMIN CH
1.0000 | ORAL_TABLET | Freq: Every day | ORAL | Status: DC
  Administered 2019-10-08: 1 via ORAL
  Filled 2019-10-07: qty 1

## 2019-10-07 MED ORDER — LACTATED RINGERS IV SOLN: 500.0000 mL | Freq: Once | INTRAVENOUS | Status: DC

## 2019-10-07 MED ORDER — EPHEDRINE 5 MG/ML INJ: 10.0000 mg | INTRAVENOUS | Status: DC | PRN

## 2019-10-07 MED ORDER — DIPHENHYDRAMINE HCL 50 MG/ML IJ SOLN: 12.5000 mg | INTRAMUSCULAR | Status: DC | PRN

## 2019-10-07 MED ORDER — COCONUT OIL OIL: 1.0000 "application " | TOPICAL_OIL | Status: DC | PRN

## 2019-10-07 MED ORDER — WITCH HAZEL-GLYCERIN EX PADS
1.0000 "application " | MEDICATED_PAD | CUTANEOUS | Status: DC | PRN
  Administered 2019-10-08: 1 via TOPICAL

## 2019-10-07 MED ORDER — ONDANSETRON HCL 4 MG/2ML IJ SOLN: 4.0000 mg | INTRAMUSCULAR | Status: DC | PRN

## 2019-10-07 MED ORDER — SIMETHICONE 80 MG PO CHEW: 80.0000 mg | CHEWABLE_TABLET | ORAL | Status: DC | PRN

## 2019-10-07 MED ORDER — ACETAMINOPHEN 325 MG PO TABS
650.0000 mg | ORAL_TABLET | Freq: Four times a day (QID) | ORAL | Status: DC | PRN
  Administered 2019-10-08 – 2019-10-09 (×4): 650 mg via ORAL
  Filled 2019-10-07 (×4): qty 2

## 2019-10-07 MED ORDER — FENTANYL-BUPIVACAINE-NACL 0.5-0.125-0.9 MG/250ML-% EP SOLN
EPIDURAL | Status: AC
  Filled 2019-10-07: qty 250

## 2019-10-07 MED ORDER — SENNOSIDES-DOCUSATE SODIUM 8.6-50 MG PO TABS
2.0000 | ORAL_TABLET | ORAL | Status: DC
  Administered 2019-10-07 – 2019-10-08 (×2): 2 via ORAL
  Filled 2019-10-07 (×2): qty 2

## 2019-10-07 MED ORDER — BENZOCAINE-MENTHOL 20-0.5 % EX AERO
1.0000 "application " | INHALATION_SPRAY | CUTANEOUS | Status: DC | PRN
  Administered 2019-10-08: 1 via TOPICAL
  Filled 2019-10-07: qty 56

## 2019-10-07 NOTE — Discharge Summary (Signed)
Postpartum Discharge Summary    Patient Name: Erika Bailey DOB: January 20, 2001 MRN: [id number]  Date of admission: 10/06/2019 Delivery date:10/07/2019  Delivering provider: Randa Ngo  Date of discharge: 10/08/2019  Admitting diagnosis: Encounter for induction of labor [Z34.90] Intrauterine pregnancy: [redacted]w[redacted]d    Secondary diagnosis:  Active Problems:   History of ELISA positive for HSV   Group B Streptococcus carrier, +RV culture, currently pregnant   Encounter for induction of labor  Additional problems: None    Discharge diagnosis: Term Pregnancy Delivered                                              Post partum procedures:None Augmentation: Pitocin, Cytotec and IP Foley Complications: None  Hospital course: Induction of Labor With Vaginal Delivery   19y.o. yo G2P0010 at 468w1das admitted to the hospital 10/06/2019 for induction of labor.  Indication for induction: Postdates.  Patient had an uncomplicated labor course as follows: Initial SVE: 0.5/thick/ballotable. Patient received Cytotec, Foley balloon, Pitocin. SROM occurred. Received epidural and progressed to complete.  Membrane Rupture Time/Date: 3:35 AM ,10/07/2019   Delivery Method:Vaginal, Spontaneous  Episiotomy: None  Lacerations:  None  Details of delivery can be found in separate delivery note. Patient had a routine postpartum course. Patient is discharged home 10/08/19.  Newborn Data: Birth date:10/07/2019  Birth time:9:05 AM  Gender:Female  Living status:Living  Apgars:9 ,9  Weight:3780 g   Magnesium Sulfate received: No BMZ received: No Rhophylac:N/A MMR:N/A T-DaP:declined Flu: No Transfusion:No  Physical exam  Vitals:   10/07/19 1200 10/07/19 1600 10/07/19 2016 10/08/19 0533  BP: (!) 100/54 114/68 107/69 (!) 98/61  Pulse: 60 56 64 67  Resp: '16 16 18 18  ' Temp: 98.1 F (36.7 C) 98.5 F (36.9 C) 98.1 F (36.7 C) 97.9 F (36.6 C)  TempSrc: Oral Oral Axillary Axillary  SpO2:   100% 100%  Weight:       Height:       General: alert, cooperative and no distress Lochia: appropriate Uterine Fundus: firm at level of umbilicus Incision: N/A DVT Evaluation: No evidence of DVT seen on physical exam. No cords or calf tenderness. No significant calf/ankle edema.  Labs: Lab Results  Component Value Date   WBC 7.6 10/06/2019   HGB 12.8 10/06/2019   HCT 40.3 10/06/2019   MCV 87.0 10/06/2019   PLT 245 10/06/2019   CMP Latest Ref Rng & Units 05/05/2019  Glucose 70 - 99 mg/dL 88  BUN 6 - 20 mg/dL 7  Creatinine 0.44 - 1.00 mg/dL 0.57  Sodium 135 - 145 mmol/L 135  Potassium 3.5 - 5.1 mmol/L 3.4(L)  Chloride 98 - 111 mmol/L 106  CO2 22 - 32 mmol/L 21(L)  Calcium 8.9 - 10.3 mg/dL 8.8(L)  Total Protein 6.5 - 8.1 g/dL 6.1(L)  Total Bilirubin 0.3 - 1.2 mg/dL 0.2(L)  Alkaline Phos 38 - 126 U/L 45  AST 15 - 41 U/L 20  ALT 0 - 44 U/L 10   Edinburgh Score: Edinburgh Postnatal Depression Scale Screening Tool 10/07/2019  I have been able to laugh and see the funny side of things. 0  I have looked forward with enjoyment to things. 0  I have blamed myself unnecessarily when things went wrong. 1  I have been anxious or worried for no good reason. 2  I have felt scared or panicky for  no good reason. 1  Things have been getting on top of me. 1  I have been so unhappy that I have had difficulty sleeping. 1  I have felt sad or miserable. 1  I have been so unhappy that I have been crying. 1  The thought of harming myself has occurred to me. 0  Edinburgh Postnatal Depression Scale Total 8    After visit meds:  Allergies as of 10/08/2019      Reactions   Peanuts [peanut Oil] Itching, Swelling      Medication List    STOP taking these medications   Blood Pressure Kit Devi   terconazole 0.4 % vaginal cream Commonly known as: TERAZOL 7   tinidazole 500 MG tablet Commonly known as: Tindamax   valACYclovir 500 MG tablet Commonly known as: Valtrex     TAKE these medications    acetaminophen 325 MG tablet Commonly known as: Tylenol Take 2 tablets (650 mg total) by mouth every 6 (six) hours.   coconut oil Oil Apply 1 application topically as needed.   [place] Supp Sm Misc Wear as directed.   diphenhydrAMINE 25 mg capsule Commonly known as: BENADRYL Take 1 capsule (25 mg total) by mouth every 6 (six) hours as needed for itching.   ibuprofen 600 MG tablet Commonly known as: ADVIL Take 1 tablet (600 mg total) by mouth every 8 (eight) hours as needed for mild pain.   norethindrone 0.35 MG tablet Commonly known as: Ortho Micronor Take 1 tablet (0.35 mg total) by mouth daily.   PrePLUS 27-1 MG Tabs Take 1 tablet by mouth daily.   senna-docusate 8.6-50 MG tablet Commonly known as: Senokot-S Take 2 tablets by mouth daily. Start taking on: October 09, 2019   simethicone 80 MG chewable tablet Commonly known as: MYLICON Chew 1 tablet (80 mg total) by mouth as needed for flatulence.     Discharge home in stable condition Infant Feeding: Breast and Bottle Infant Disposition:home with mother Discharge instruction: per After Visit Summary and Postpartum booklet. Activity: Advance as tolerated. Pelvic rest for 6 weeks.  Diet: routine diet Future Appointments: Future Appointments  Date Time Provider [place]  11/04/2019  2:00 PM Nugent, Gerrie Nordmann, NP [place] None   Follow up Visit: Please schedule this patient for a Virtual postpartum visit in 4 weeks with the following provider: Any provider. Additional Postpartum F/U:none  Low risk pregnancy complicated by: none Delivery mode:  Vaginal, Spontaneous  Anticipated Birth Control: POPs prescribed per pt request at discharge  10/08/2019 Randa Ngo, MD

## 2019-10-07 NOTE — Progress Notes (Signed)
Labor Progress Note DIAMANTE TRUSZKOWSKI is a 19 y.o. G2P0010 at [redacted]w[redacted]d presented for postdates induction of labor  S: No change at this time.  O:  BP 114/67   Pulse 77   Temp 98.6 F (37 C) (Oral)   Resp 16   Ht 5\' 6"  (1.676 m)   Wt 79.6 kg   LMP 12/23/2018 (Exact Date)   SpO2 100%   BMI 28.31 kg/m   FHT: FHR 115 bpm, Moderate variability, + Accels, - Decels TOCO: every 2-3 mins  CVE: Dilation: 8.5 Effacement (%): 90 Station: -1 Presentation: Vertex Exam by:: K Faucett RN   A&P: 19 y.o. G2P0010 [redacted]w[redacted]d for PD IOL #Labor: Progressing well. S/p FB and cytotec. Pitiocin started at 1800, continue to titrate  #Pain: Epidural in place #FWB: Category I #GBS positive, PCN started  Anticipate vaginal delivery  [redacted]w[redacted]d, DO 5:34 AM

## 2019-10-07 NOTE — Progress Notes (Signed)
Labor Progress Note Erika Bailey is a 19 y.o. G2P0010 at [redacted]w[redacted]d presented for postdates induction of labor  S:  Comfortable with Epidural  O:  BP 101/80   Pulse 104   Temp 98.6 F (37 C) (Oral)   Resp 16   Ht 5\' 6"  (1.676 m)   Wt 79.6 kg   LMP 12/23/2018 (Exact Date)   SpO2 100%   BMI 28.31 kg/m   FHT: FHR 130 bpm, Moderate variability, + Accels, - Decels TOCO: every 3-4 mins  CVE: Dilation: 10 Dilation Complete Date: 10/07/19 Dilation Complete Time: 0724 Effacement (%): 90 Station: Plus 1 Presentation: Vertex Exam by:: [email or web address] CNM   A&P: 19 y.o. G2P0010 [redacted]w[redacted]d for PD IOL #Labor: Progressing well. S/p FB and cytotec. Pitiocin started at 1800, continue to titrate  #Pain: Epidural in place #FWB: Category I #GBS positive, PCN started  Anticipate vaginal delivery  [redacted]w[redacted]d, DO 7:31 AM

## 2019-10-07 NOTE — Progress Notes (Signed)
Labor Progress Note Erika Bailey is a 19 y.o. G2P0010 at [redacted]w[redacted]d presented for postdates induction of labor  S: Comfortable.  O:  BP (!) 103/60    Pulse 90    Temp 98.6 F (37 C) (Oral)    Resp 16    Ht 5\' 6"  (1.676 m)    Wt 79.6 kg    LMP 12/23/2018 (Exact Date)    BMI 28.31 kg/m   FHT: FHR 120 bpm, Moderate variability, + Accels, - Decels TOCO: every 2-3 mins  CVE: Dilation: 6 Effacement (%): 70, 80 Station: -2 Presentation: Vertex Exam by:: K Faucett RN   A&P: 19 y.o. G2P0010 [redacted]w[redacted]d for PD IOL #Labor: Progressing well. S/p FB and cytotec. Pitiocin started at 1800, continue to titrate  #Pain: per patient request #FWB: Category I #GBS positive, PCN started  Anticipate vaginal delivery  [redacted]w[redacted]d, DO 2:07 AM

## 2019-10-07 NOTE — Anesthesia Postprocedure Evaluation (Signed)
Anesthesia Post Note  Patient: URVI IMES  Procedure(s) Performed: AN AD HOC LABOR EPIDURAL     Patient location during evaluation: Mother Baby Anesthesia Type: Epidural Level of consciousness: awake and alert Pain management: pain level controlled Vital Signs Assessment: post-procedure vital signs reviewed and stable Respiratory status: spontaneous breathing, nonlabored ventilation and respiratory function stable Cardiovascular status: stable Postop Assessment: no headache, no backache and epidural receding Anesthetic complications: no   No complications documented.  Last Vitals:  Vitals:   10/07/19 0830 10/07/19 0900  BP: (!) 100/57 104/72  Pulse: 82 (!) 116  Resp: 16 16  Temp:    SpO2:  99%    Last Pain:  Vitals:   10/07/19 0631  TempSrc: Oral   Pain Goal:                Epidural/Spinal Function Cutaneous sensation: Tingles (10/07/19 0915), Patient able to flex knees: Yes (10/07/19 0915), Patient able to lift hips off bed: Yes (10/07/19 0915), Back pain beyond tenderness at insertion site: No (10/07/19 0915), Progressively worsening motor and/or sensory loss: No (10/07/19 0915), Bowel and/or bladder incontinence post epidural: No (10/07/19 0915)  Gwendola Hornaday

## 2019-10-07 NOTE — Anesthesia Preprocedure Evaluation (Signed)
Anesthesia Evaluation  Patient identified by MRN, date of birth, ID band Patient awake    Reviewed: Allergy & Precautions, Patient's Chart, lab work & pertinent test results  Airway Mallampati: II  TM Distance: >3 FB Neck ROM: Full    Dental no notable dental hx.    Pulmonary former smoker,    Pulmonary exam normal breath sounds clear to auscultation       Cardiovascular negative cardio ROS Normal cardiovascular exam Rhythm:Regular Rate:Normal     Neuro/Psych negative neurological ROS     GI/Hepatic negative GI ROS, Neg liver ROS,   Endo/Other  negative endocrine ROS  Renal/GU negative Renal ROS     Musculoskeletal negative musculoskeletal ROS (+)   Abdominal   Peds  Hematology negative hematology ROS (+)   Anesthesia Other Findings   Reproductive/Obstetrics (+) Pregnancy                             Anesthesia Physical Anesthesia Plan  ASA: II and emergent  Anesthesia Plan: Epidural   Post-op Pain Management:    Induction:   PONV Risk Score and Plan: 2  Airway Management Planned: Natural Airway  Additional Equipment: None  Intra-op Plan:   Post-operative Plan:   Informed Consent: I have reviewed the patients History and Physical, chart, labs and discussed the procedure including the risks, benefits and alternatives for the proposed anesthesia with the patient or authorized representative who has indicated his/her understanding and acceptance.       Plan Discussed with:   Anesthesia Plan Comments:         Anesthesia Quick Evaluation

## 2019-10-07 NOTE — Plan of Care (Signed)
Position changes to facilitate optimal fetal positioning and descent.    Problem: Education: Goal: Knowledge of General Education information will improve Description: Including pain rating scale, medication(s)/side effects and non-pharmacologic comfort measures Outcome: Progressing   Problem: Health Behavior/Discharge Planning: Goal: Ability to manage health-related needs will improve Outcome: Progressing   Problem: Clinical Measurements: Goal: Ability to maintain clinical measurements within normal limits will improve Outcome: Progressing Goal: Will remain free from infection Outcome: Progressing Goal: Diagnostic test results will improve Outcome: Progressing Goal: Respiratory complications will improve Outcome: Progressing Goal: Cardiovascular complication will be avoided Outcome: Progressing   Problem: Activity: Goal: Risk for activity intolerance will decrease Outcome: Progressing   Problem: Nutrition: Goal: Adequate nutrition will be maintained Outcome: Progressing   Problem: Coping: Goal: Level of anxiety will decrease Outcome: Progressing   Problem: Elimination: Goal: Will not experience complications related to bowel motility Outcome: Progressing Goal: Will not experience complications related to urinary retention Outcome: Progressing   Problem: Pain Managment: Goal: General experience of comfort will improve Outcome: Progressing   Problem: Safety: Goal: Ability to remain free from injury will improve Outcome: Progressing   Problem: Skin Integrity: Goal: Risk for impaired skin integrity will decrease Outcome: Progressing   Problem: Education: Goal: Knowledge of Childbirth will improve Outcome: Progressing Goal: Ability to make informed decisions regarding treatment and plan of care will improve Outcome: Progressing Goal: Ability to state and carry out methods to decrease the pain will improve Outcome: Progressing Goal: Individualized Educational  Video(s) Outcome: Progressing   Problem: Coping: Goal: Ability to verbalize concerns and feelings about labor and delivery will improve Outcome: Progressing   Problem: Life Cycle: Goal: Ability to make normal progression through stages of labor will improve Outcome: Progressing Goal: Ability to effectively push during vaginal delivery will improve Outcome: Progressing   Problem: Role Relationship: Goal: Will demonstrate positive interactions with the child Outcome: Progressing   Problem: Safety: Goal: Risk of complications during labor and delivery will decrease Outcome: Progressing   Problem: Pain Management: Goal: Relief or control of pain from uterine contractions will improve Outcome: Progressing

## 2019-10-07 NOTE — Anesthesia Procedure Notes (Signed)
Epidural Patient location during procedure: OB Start time: 10/07/2019 4:47 AM End time: 10/07/2019 5:07 AM  Staffing Anesthesiologist: Lannie Fields, DO Performed: anesthesiologist   Preanesthetic Checklist Completed: patient identified, IV checked, risks and benefits discussed, monitors and equipment checked, pre-op evaluation and timeout performed  Epidural Patient position: sitting Prep: DuraPrep and site prepped and draped Patient monitoring: continuous pulse ox, blood pressure, heart rate and cardiac monitor Approach: midline Location: L3-L4 Injection technique: LOR air  Needle:  Needle type: Tuohy  Needle gauge: 17 G Needle length: 9 cm Needle insertion depth: 6 cm Catheter type: closed end flexible Catheter size: 19 Gauge Catheter at skin depth: 12 cm Test dose: negative  Assessment Sensory level: T8 Events: blood not aspirated, injection not painful, no injection resistance, no paresthesia and negative IV test  Additional Notes Patient identified. Risks/Benefits/Options discussed with patient including but not limited to bleeding, infection, nerve damage, paralysis, failed block, incomplete pain control, headache, blood pressure changes, nausea, vomiting, reactions to medication both or allergic, itching and postpartum back pain. Confirmed with bedside nurse the patient's most recent platelet count. Confirmed with patient that they are not currently taking any anticoagulation, have any bleeding history or any family history of bleeding disorders. Patient expressed understanding and wished to proceed. All questions were answered. Sterile technique was used throughout the entire procedure. Please see nursing notes for vital signs. Test dose was given through epidural catheter and negative prior to continuing to dose epidural or start infusion. Warning signs of high block given to the patient including shortness of breath, tingling/numbness in hands, complete motor  block, or any concerning symptoms with instructions to call for help. Patient was given instructions on fall risk and not to get out of bed. All questions and concerns addressed with instructions to call with any issues or inadequate analgesia.  Reason for block:procedure for pain

## 2019-10-08 MED ORDER — IBUPROFEN 600 MG PO TABS: 600.0000 mg | ORAL_TABLET | Freq: Three times a day (TID) | ORAL | 0 refills | Status: DC | PRN

## 2019-10-08 MED ORDER — SENNOSIDES-DOCUSATE SODIUM 8.6-50 MG PO TABS: 2.0000 | ORAL_TABLET | ORAL | Status: DC

## 2019-10-08 MED ORDER — SIMETHICONE 80 MG PO CHEW: 80.0000 mg | CHEWABLE_TABLET | ORAL | 0 refills | Status: DC | PRN

## 2019-10-08 MED ORDER — NORETHINDRONE 0.35 MG PO TABS
1.0000 | ORAL_TABLET | Freq: Every day | ORAL | 2 refills | Status: DC
Start: 2019-10-08 — End: 2020-12-09

## 2019-10-08 MED ORDER — COCONUT OIL OIL: 1.0000 "application " | TOPICAL_OIL | 0 refills | Status: DC | PRN

## 2019-10-08 MED ORDER — ACETAMINOPHEN 325 MG PO TABS: 650.0000 mg | ORAL_TABLET | Freq: Four times a day (QID) | ORAL | Status: DC

## 2019-10-08 MED ORDER — DIPHENHYDRAMINE HCL 25 MG PO CAPS: 25.0000 mg | ORAL_CAPSULE | Freq: Four times a day (QID) | ORAL | 0 refills | Status: DC | PRN

## 2019-10-08 NOTE — Lactation Note (Signed)
This note was copied from a baby's chart. Lactation Consultation Note  Patient Name: Erika Bailey OZDGU'Y Date: 10/08/2019 Reason for consult: Initial assessment;Term;Other (Comment);Infant weight loss (baby is post dates/ LC enc to call with feeding cues / post circ) Baby is 69 hours old  As LC entered the room , mom resting in bed eating lunch and grandmother Present. Mom mentioned baby had a circ this am and ate prior to circ , and presently is in the nursery to have extra digits taken off.  Per mom baby has been sleepy after circ. LC reassured mom that is normal.  Mom mentioned the RN set up the DEBP last night and she has been hand expressing/ spoon feeding.  LC recommended mom call with feeding cues for feeding assessment.  Per mom active with [place] - WIC and has a hand pump at home.  LC provided the [facility] pamphlet with phone numbers.    Maternal Data    Feeding Feeding Type: Breast Fed  LATCH Score                   Interventions Interventions: Breast feeding basics reviewed  Lactation Tools Discussed/Used WIC Program: Yes Pump Review: Milk Storage   Consult Status Consult Status: Follow-up Date: 10/08/19 Follow-up type: In-patient    [name] 10/08/2019, 12:36 PM

## 2019-10-08 NOTE — Discharge Instructions (Signed)

## 2019-10-08 NOTE — Lactation Note (Signed)
This note was copied from a baby's chart. Lactation Consultation Note  Patient Name: Erika Bailey IOMBT'D Date: 10/08/2019  P1, 16 hour term female infant. LC entered the room mom and infant asleep at this time.   Maternal Data    Feeding Feeding Type: Breast Fed  LATCH Score Latch: Repeated attempts needed to sustain latch, nipple held in mouth throughout feeding, stimulation needed to elicit sucking reflex.  Audible Swallowing: None  Type of Nipple: Everted at rest and after stimulation  Comfort (Breast/Nipple): Soft / non-tender  Hold (Positioning): Assistance needed to correctly position infant at breast and maintain latch.  LATCH Score: 6  Interventions Interventions: Breast feeding basics reviewed;Assisted with latch;Skin to skin;Breast massage;Hand express;Adjust position;Support pillows;Position options;Expressed milk;DEBP;Hand pump  Lactation Tools Discussed/Used     Consult Status      Danelle Earthly 10/08/2019, 1:09 AM

## 2019-10-09 NOTE — Discharge Summary (Addendum)
Postpartum Discharge Summary  Patient's discharge was delayed to 10/09/19 given need for continued hospital care for baby.                          Patient Name: Erika Bailey DOB: December 17, 2000 MRN: [id number]  Date of admission: 10/06/2019 Delivery date:10/07/2019  Delivering provider: Randa Ngo  Date of discharge: 10/09/2019  Admitting diagnosis: Encounter for induction of labor [Z34.90] Intrauterine pregnancy: [redacted]w[redacted]d    Secondary diagnosis:  Active Problems:   History of ELISA positive for HSV   Group B Streptococcus carrier, +RV culture, currently pregnant   Encounter for induction of labor  Additional problems: None                           Discharge diagnosis: Term Pregnancy Delivered                                              Post partum procedures:None Augmentation: Pitocin, Cytotec and IP Foley Complications: None  Hospital course: Induction of Labor With Vaginal Delivery   19y.o. yo G2P0010 at 437w1das admitted to the hospital 10/06/2019 for induction of labor.  Indication for induction: Postdates.  Patient had an uncomplicated labor course as follows: Initial SVE: 0.5/thick/ballotable. Patient received Cytotec, Foley balloon, Pitocin. SROM occurred. Received epidural and progressed to complete.  Membrane Rupture Time/Date: 3:35 AM ,10/07/2019   Delivery Method:Vaginal, Spontaneous  Episiotomy: None  Lacerations:  None  Details of delivery can be found in separate delivery note. Patient had a routine postpartum course. Patient is discharged home 10/09/19.  Newborn Data: Birth date:10/07/2019  Birth time:9:05 AM  Gender:Female  Living status:Living  Apgars:9 ,9  Weight:3780 g   Magnesium Sulfate received: No BMZ received: No Rhophylac:N/A MMR:N/A T-DaP:declined Flu: No Transfusion:No  Physical exam        Vitals:   10/07/19 1200 10/07/19 1600 10/07/19 2016 10/08/19 0533  BP: (!) 100/54 114/68 107/69 (!) 98/61  Pulse: 60 56 64 67  Resp: '16 16  18 18  ' Temp: 98.1 F (36.7 C) 98.5 F (36.9 C) 98.1 F (36.7 C) 97.9 F (36.6 C)  TempSrc: Oral Oral Axillary Axillary  SpO2:   100% 100%  Weight:      Height:       General: alert, cooperative and no distress Lochia: appropriate Uterine Fundus: firm at level of umbilicus Incision: N/A DVT Evaluation: No evidence of DVT seen on physical exam. No cords or calf tenderness. No significant calf/ankle edema.  Labs: Recent Labs       Lab Results  Component Value Date   WBC 7.6 10/06/2019   HGB 12.8 10/06/2019   HCT 40.3 10/06/2019   MCV 87.0 10/06/2019   PLT 245 10/06/2019     CMP Latest Ref Rng & Units 05/05/2019  Glucose 70 - 99 mg/dL 88  BUN 6 - 20 mg/dL 7  Creatinine 0.44 - 1.00 mg/dL 0.57  Sodium 135 - 145 mmol/L 135  Potassium 3.5 - 5.1 mmol/L 3.4(L)  Chloride 98 - 111 mmol/L 106  CO2 22 - 32 mmol/L 21(L)  Calcium 8.9 - 10.3 mg/dL 8.8(L)  Total Protein 6.5 - 8.1 g/dL 6.1(L)  Total Bilirubin 0.3 - 1.2 mg/dL 0.2(L)  Alkaline Phos 38 - 126 U/L 45  AST 15 - 41  U/L 20  ALT 0 - 44 U/L 10   Edinburgh Score: Edinburgh Postnatal Depression Scale Screening Tool 10/07/2019  I have been able to laugh and see the funny side of things. 0  I have looked forward with enjoyment to things. 0  I have blamed myself unnecessarily when things went wrong. 1  I have been anxious or worried for no good reason. 2  I have felt scared or panicky for no good reason. 1  Things have been getting on top of me. 1  I have been so unhappy that I have had difficulty sleeping. 1  I have felt sad or miserable. 1  I have been so unhappy that I have been crying. 1  The thought of harming myself has occurred to me. 0  Edinburgh Postnatal Depression Scale Total 8    After visit meds:       Allergies as of 10/08/2019      Reactions   Peanuts [peanut Oil] Itching, Swelling         Medication List    STOP taking these medications   Blood Pressure Kit Devi    terconazole 0.4 % vaginal cream Commonly known as: TERAZOL 7   tinidazole 500 MG tablet Commonly known as: Tindamax   valACYclovir 500 MG tablet Commonly known as: Valtrex     TAKE these medications   acetaminophen 325 MG tablet Commonly known as: Tylenol Take 2 tablets (650 mg total) by mouth every 6 (six) hours.   coconut oil Oil Apply 1 application topically as needed.   [place] Supp Sm Misc Wear as directed.   diphenhydrAMINE 25 mg capsule Commonly known as: BENADRYL Take 1 capsule (25 mg total) by mouth every 6 (six) hours as needed for itching.   ibuprofen 600 MG tablet Commonly known as: ADVIL Take 1 tablet (600 mg total) by mouth every 8 (eight) hours as needed for mild pain.   norethindrone 0.35 MG tablet Commonly known as: Ortho Micronor Take 1 tablet (0.35 mg total) by mouth daily.   PrePLUS 27-1 MG Tabs Take 1 tablet by mouth daily.   senna-docusate 8.6-50 MG tablet Commonly known as: Senokot-S Take 2 tablets by mouth daily. Start taking on: October 09, 2019   simethicone 80 MG chewable tablet Commonly known as: MYLICON Chew 1 tablet (80 mg total) by mouth as needed for flatulence.     Discharge home in stable condition Infant Feeding: Breast and Bottle Infant Disposition:home with mother Discharge instruction: per After Visit Summary and Postpartum booklet. Activity: Advance as tolerated. Pelvic rest for 6 weeks.  Diet: routine diet Future Appointments:       Future Appointments  Date Time Provider [place]  11/04/2019  2:00 PM Nugent, Gerrie Nordmann, NP [place] None   Follow up Visit: Please schedule this patient for a Virtual postpartum visit in 4 weeks with the following provider: Any provider. Additional Postpartum F/U:none  Low risk pregnancy complicated by: none Delivery mode: Vaginal, Spontaneous  Anticipated Birth Control:POPs prescribed per pt request at discharge  10/09/2019 Randa Ngo,  MD

## 2019-10-11 ENCOUNTER — Telehealth: Payer: Self-pay | Admitting: *Deleted

## 2019-10-11 DIAGNOSIS — Z79899 Other long term (current) drug therapy: Secondary | ICD-10-CM

## 2019-10-11 DIAGNOSIS — Z3480 Encounter for supervision of other normal pregnancy, unspecified trimester: Secondary | ICD-10-CM

## 2019-10-11 NOTE — Telephone Encounter (Signed)
Contacted pt to complete transition of care assessment:  Transition Care Management Follow-up Telephone Call  . Medicaid Managed Care Transition Call Status:MM [facility] Call Made  . Date of discharge and from where: [facility], 10/09/19  . How have you been since you were released from the hospital? "ok"  . Any questions or concerns? No  Items Reviewed: Marland Kitchen Did the pt receive and understand the discharge instructions provided? Yes  . Medications obtained and verified? Yes ; the pt states she did not pick up her birth control pills because she did not know if Medicaid would pay for it  . Any new allergies since your discharge? Yes  . Dietary orders reviewed?yes . Do you have support at home? Yes, family  Functional Questionnaire: (I = Independent and D = Dependent)  ADLs: Independent Bathing/Dressing:Independent Meal Prep: Independent Eating: Independent Maintaining continence: Independent Transferring/Ambulation: Independent Managing Meds: Independent Follow up appointments reviewed:  PCP Hospital f/u appt confirmed? No  Specialist Hospital f/u appt confirmed? Scheduled to see Donia Ast, OB/GYN on 826/21 @ 1400.  Are transportation arrangements needed? No   If their condition worsens, is the pt aware to call PCP or go to the EmergencyDept.? yes Was the patient provided with contact information for the PCP's office or ED? yes  Was to pt encouraged to call back with questions or concerns? yes  Orders placed for Community Continuation of Care for CM  (medication payment with Medicaid)  Burnard Bunting, RN, BSN, CCRN [facility] [phone]

## 2019-11-04 ENCOUNTER — Ambulatory Visit: Payer: Medicaid Other | Admitting: Women's Health

## 2019-12-10 ENCOUNTER — Ambulatory Visit: Payer: Medicaid Other | Admitting: Obstetrics

## 2020-02-25 IMAGING — US US OB TRANSVAGINAL
1 series · 15 of 28 positions shown · non-contrast
Comparison: 12/18/2017

CLINICAL DATA: First trimester pregnancy with inconclusive fetal
viability.

EXAM:
TRANSVAGINAL OB ULTRASOUND
TECHNIQUE: Transvaginal ultrasound was performed for complete evaluation of the
gestation as well as the maternal uterus, adnexal regions, and
pelvic cul-de-sac.

[Series 1: us ob transvaginal · 33 acquisitions, 15 frames shown]
[im 1/33]
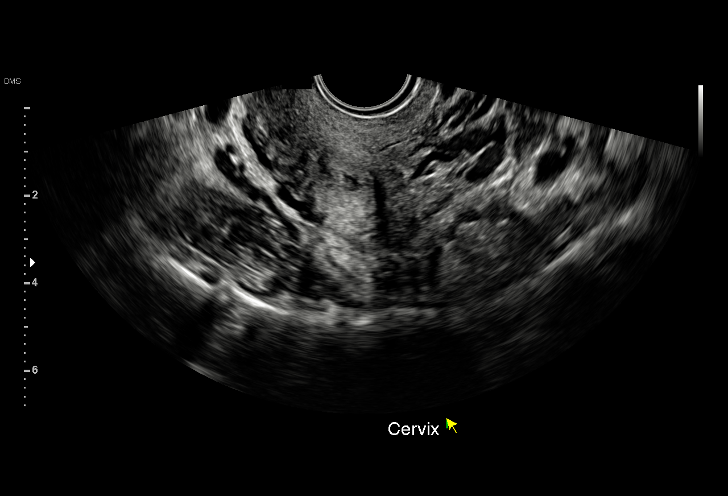
[im 3/33]
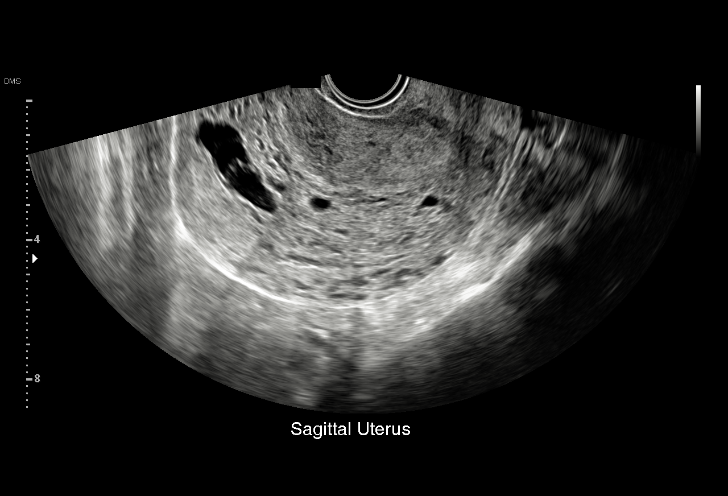
[im 5/33]
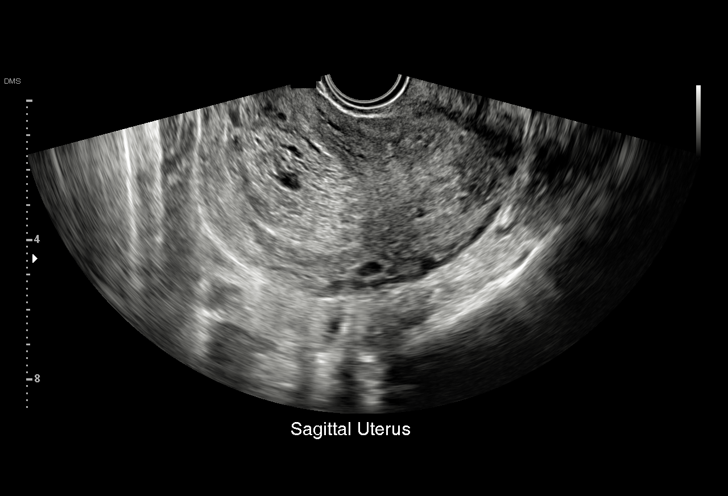
[im 8/33]
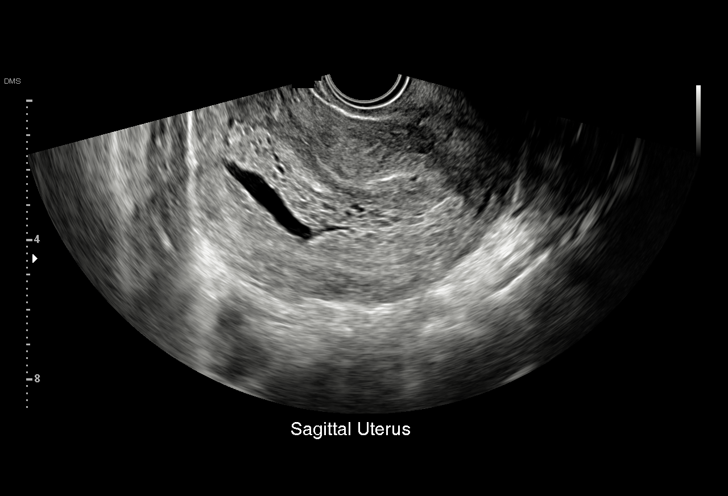
[im 10/33]
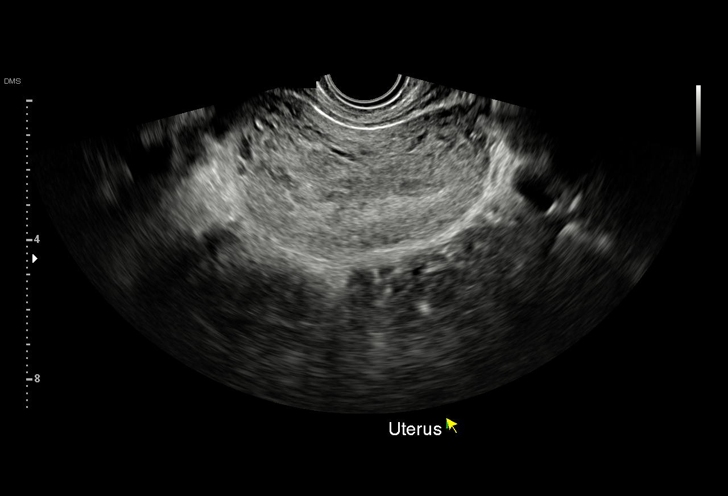
[im 12/33]
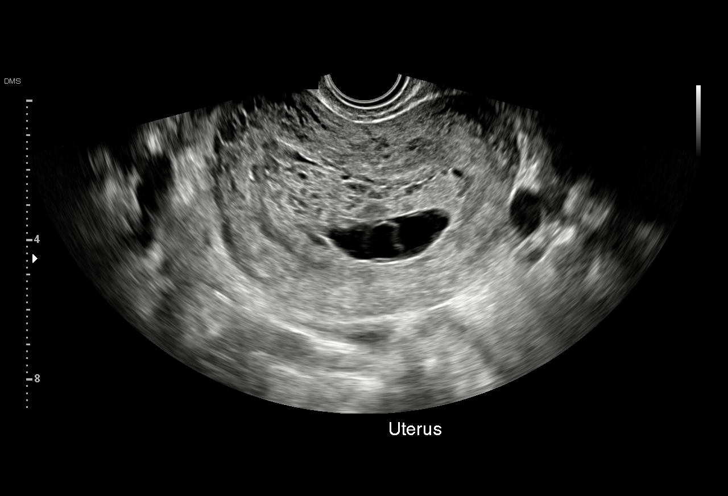
[im 15/33]
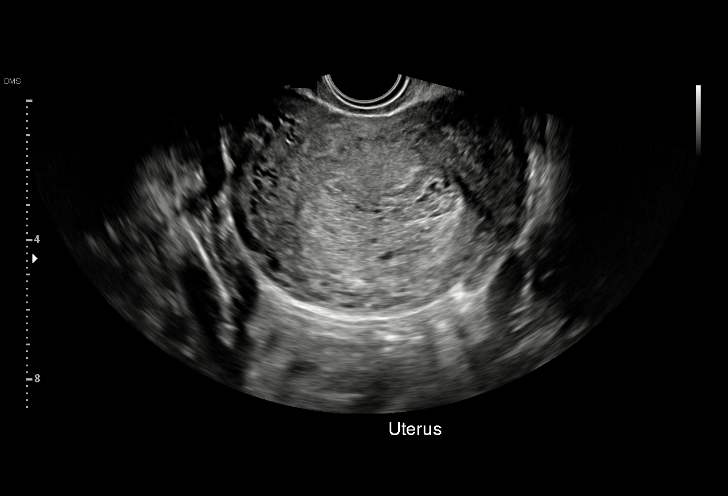
[im 17/33]
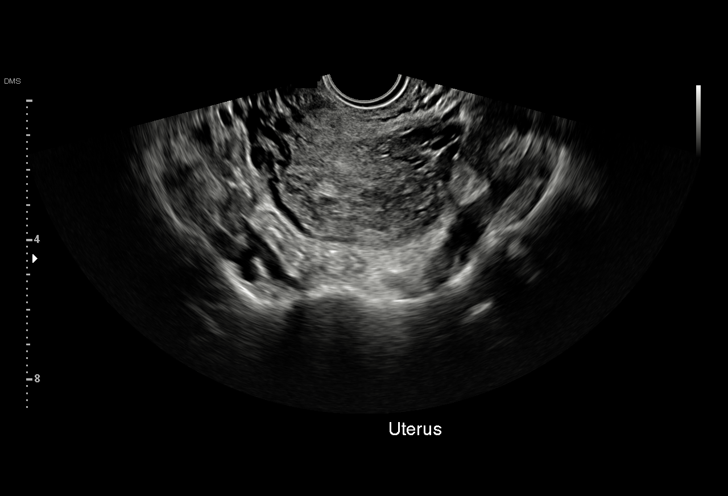
[im 18/33]
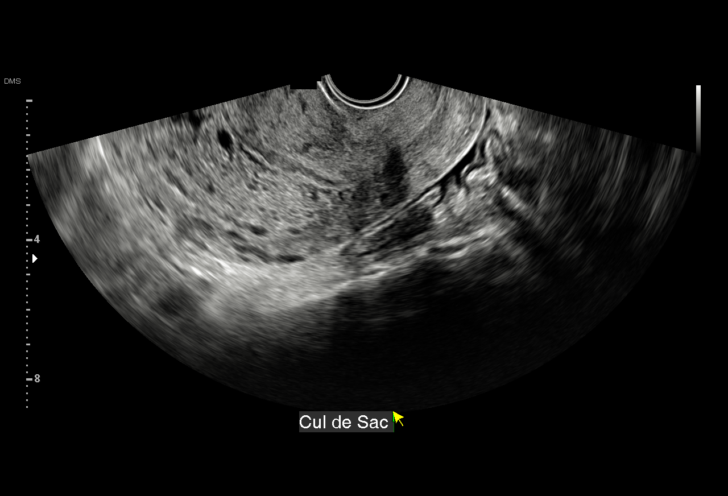
[im 21/33]
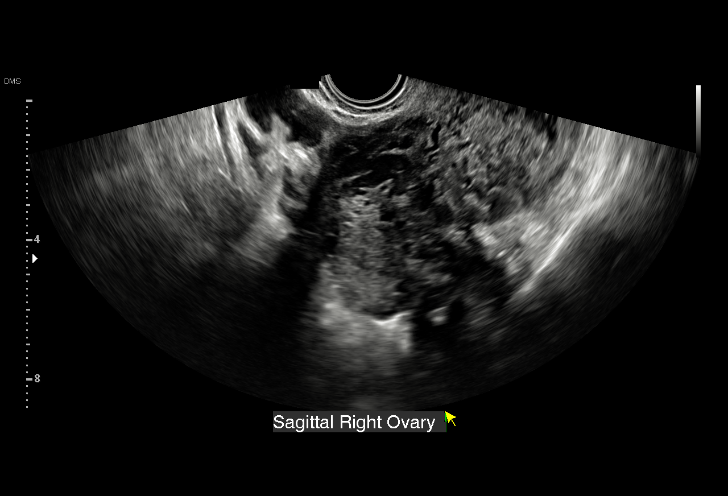
[im 23/33]
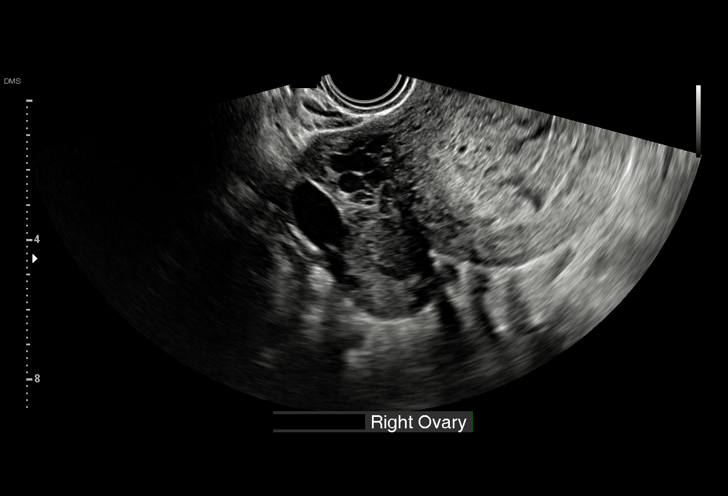
[im 25/33]
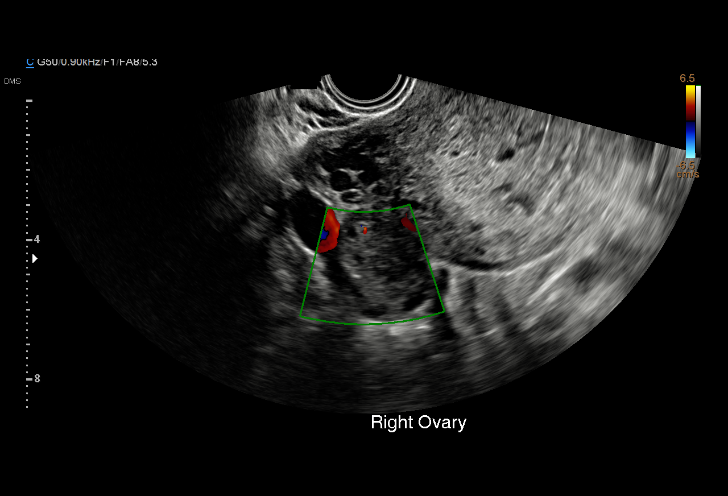
[im 28/33]
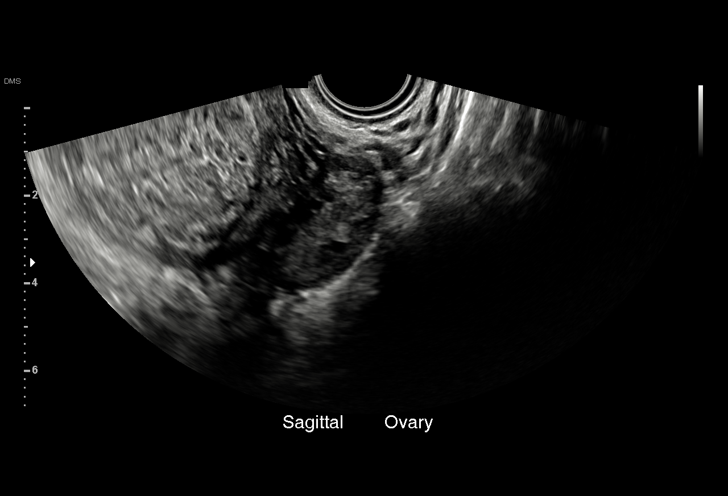
[im 30/33]
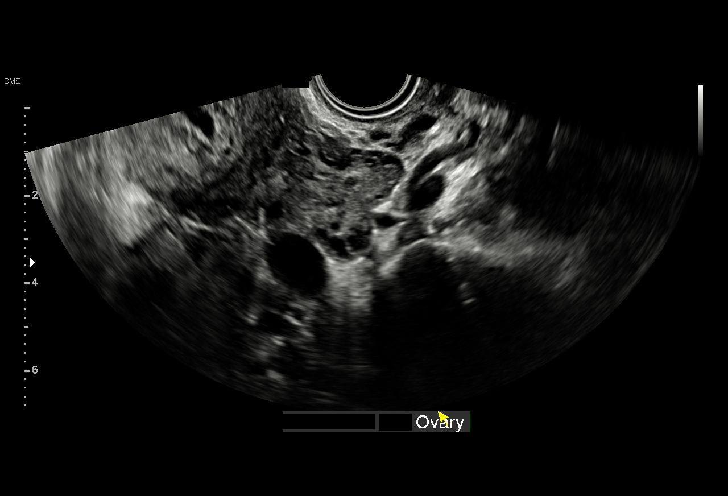
[im 33/33]
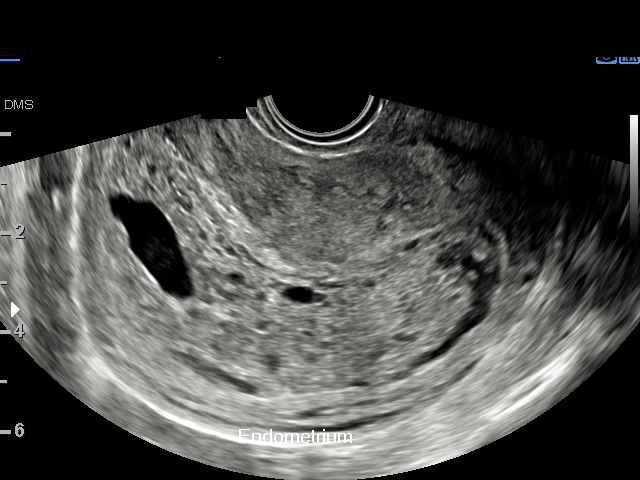

[15 of 28 positions shown; findings below may reference images not displayed]

FINDINGS: Intrauterine gestational sac: Single, with irregular shape

Yolk sac:  Enlarged and irregular shape, versus chorion

Embryo:  Not Visualized.

MSD: 27 mm

Subchorionic hemorrhage: None visualized. Numerous tiny cystic foci
seen within surrounding decidual reaction, consistent with hydropic
change.

Maternal uterus/adnexae: Normal appearance of both ovaries. No
adnexal mass identified.
IMPRESSION: Findings meet definitive criteria for failed IUP. This follows SRU
consensus guidelines: Diagnostic Criteria for Nonviable Pregnancy
Early in the First Trimester. N Engl J Med 3570;[DATE].

## 2020-03-16 IMAGING — US US OB TRANSVAGINAL
1 series · 15 of 28 positions shown · non-contrast
Comparison: Pelvic ultrasound performed 01/01/2018

CLINICAL DATA: Acute onset of vaginal bleeding.

EXAM:
TRANSVAGINAL OB ULTRASOUND
TECHNIQUE: Transvaginal ultrasound was performed for complete evaluation of the
gestation as well as the maternal uterus, adnexal regions, and
pelvic cul-de-sac.

[Series 1: us ob transvaginal · 44 acquisitions, 15 frames shown]
[im 1/44]
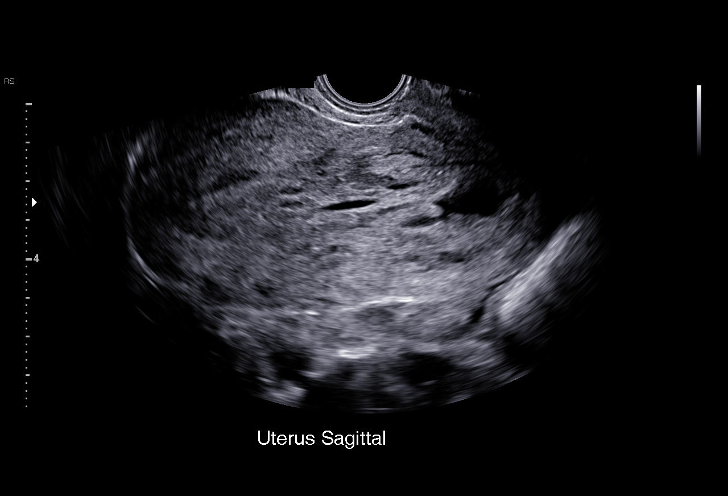
[im 4/44]
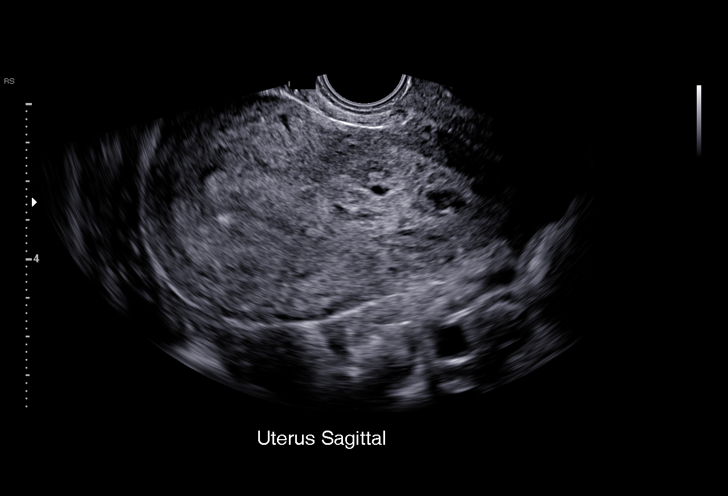
[im 7/44]
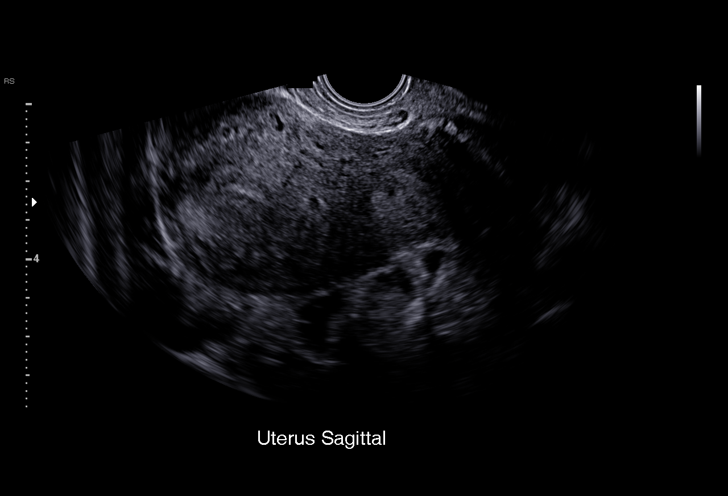
[im 10/44]
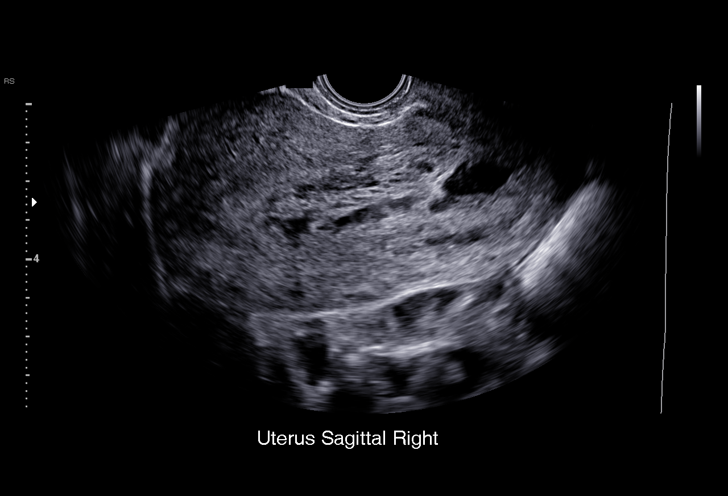
[im 13/44]
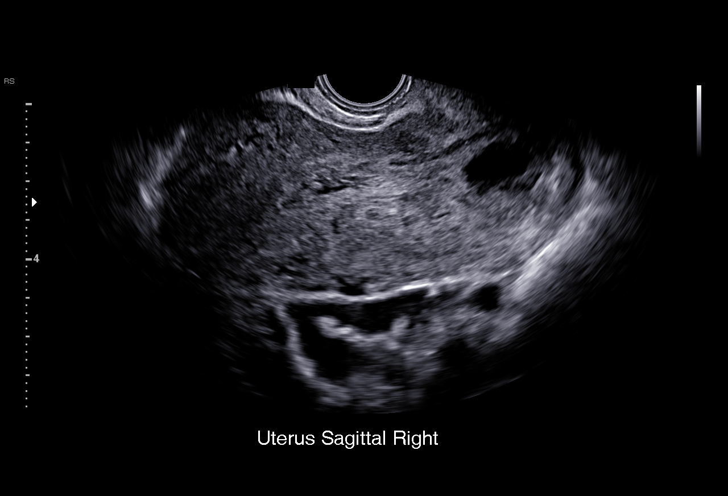
[im 16/44]
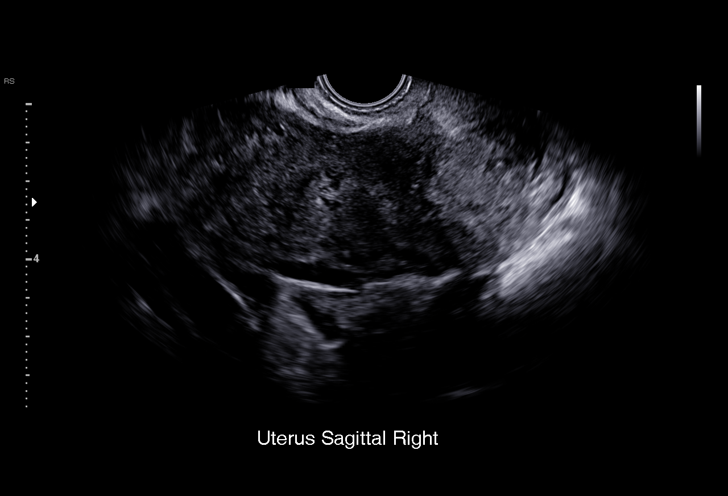
[im 20/44]
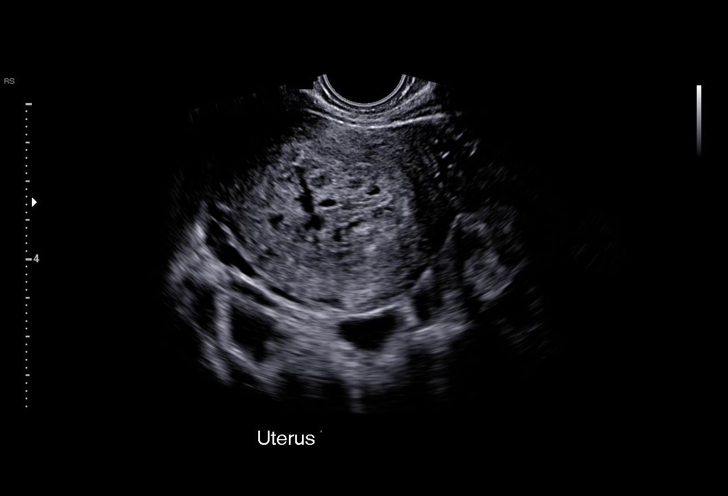
[im 23/44]
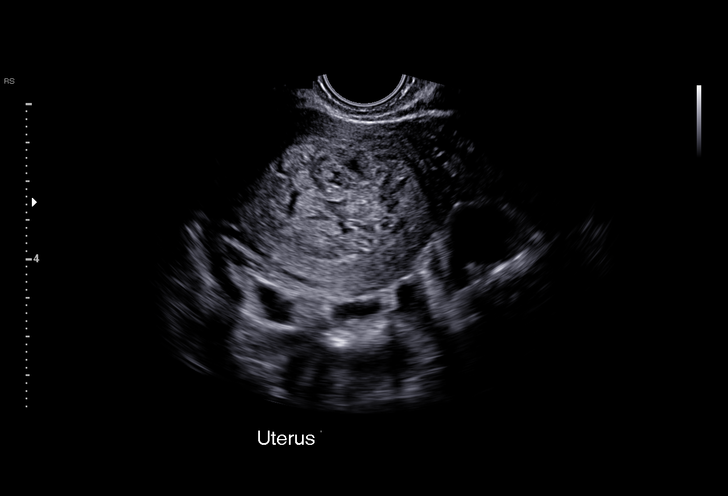
[im 24/44]
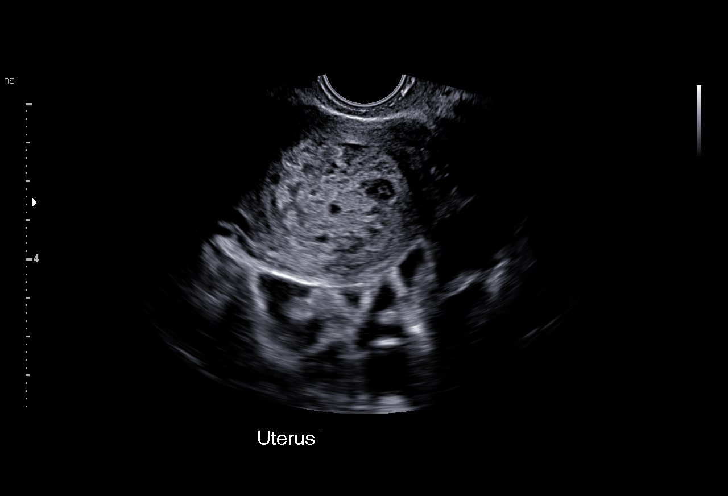
[im 28/44]
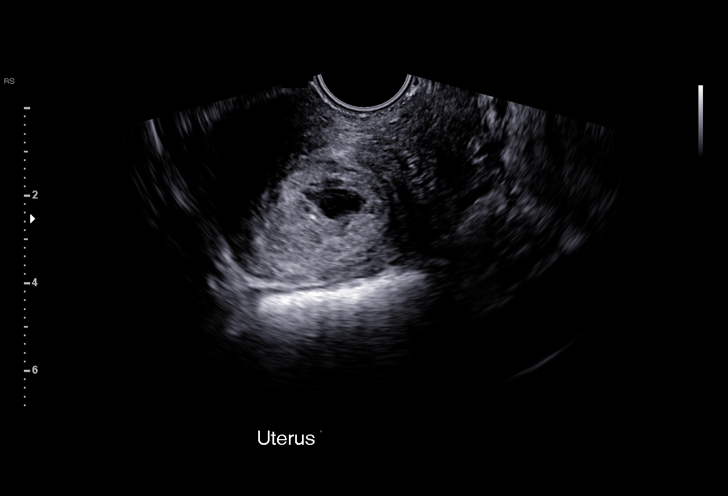
[im 31/44]
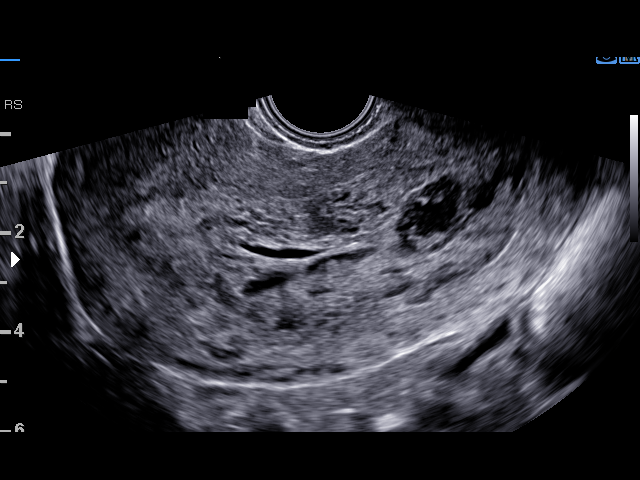
[im 34/44]
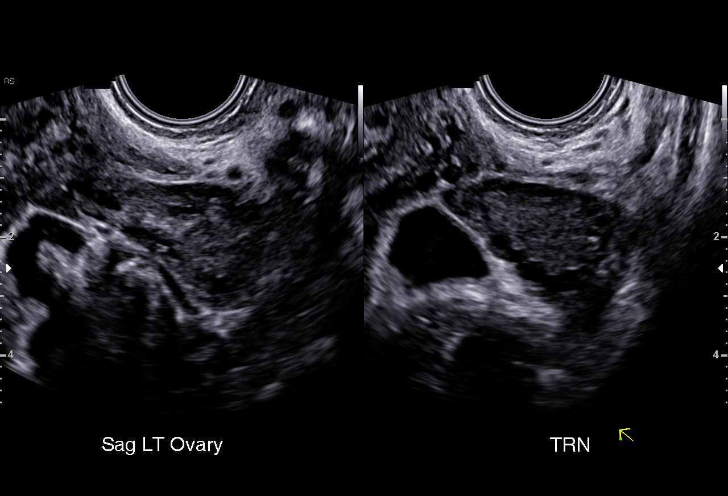
[im 37/44]
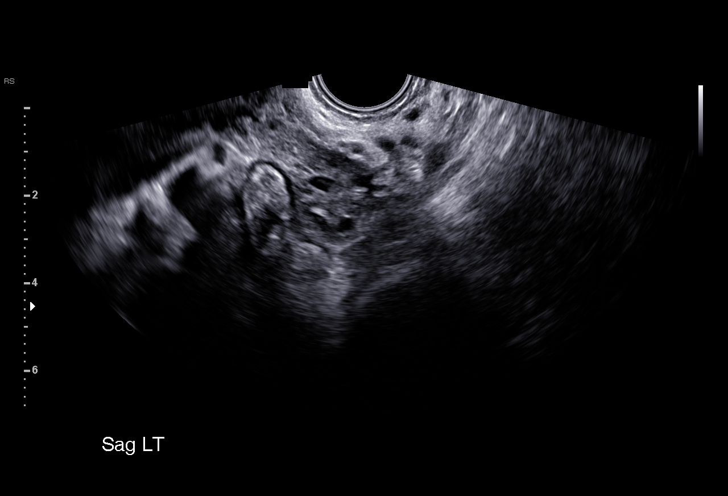
[im 40/44]
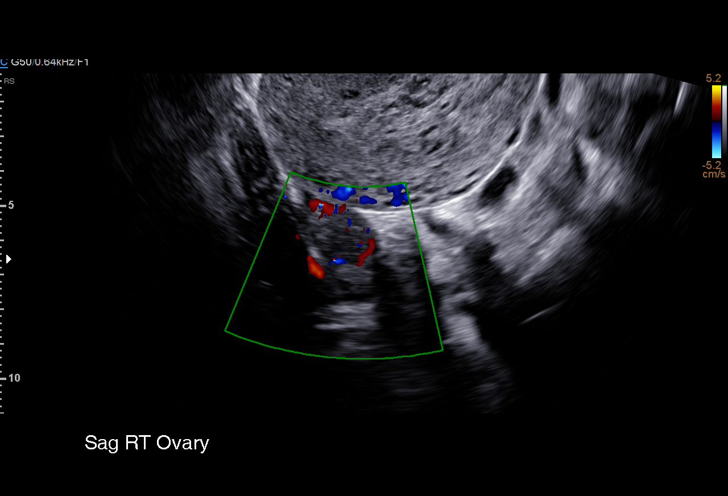
[im 44/44]
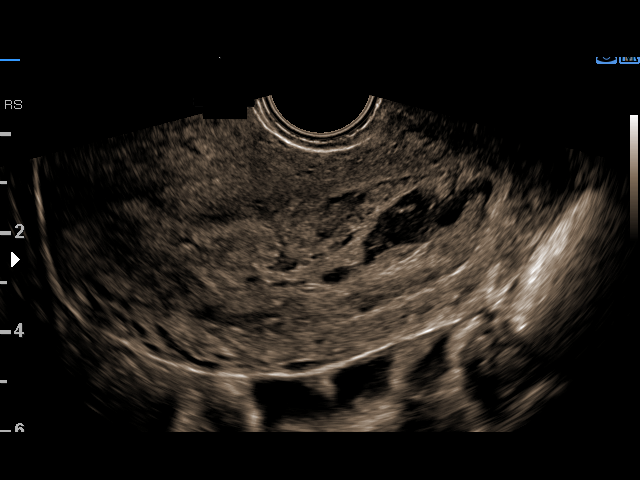

[15 of 28 positions shown; findings below may reference images not displayed]

FINDINGS: Intrauterine gestational sac: None seen.

Yolk sac:  N/A

Embryo:  N/A

Subchorionic hemorrhage:  None visualized.

Maternal uterus/adnexae: The endometrial echo complex is somewhat
thickened and heterogeneous in appearance, with cystic areas. This
is of uncertain significance. There is no definite evidence of
retained products of conception.

The ovaries are within normal limits. There is no evidence of
ovarian torsion. No suspicious adnexal masses are seen.

Trace free fluid is seen within the pelvic cul-de-sac.
IMPRESSION: No intrauterine gestational sac seen. Thickened endometrium with
cystic areas, of uncertain significance. No definite evidence for
retained products of conception. Given the patient's falling
quantitative beta HCG, this reflects recent spontaneous abortion.

If the quantitative beta HCG level rises, further evaluation would
be helpful to exclude a molar pregnancy.

## 2020-03-22 ENCOUNTER — Ambulatory Visit (HOSPITAL_COMMUNITY)
Admission: EM | Admit: 2020-03-22 | Discharge: 2020-03-22 | Payer: Medicaid Other | Attending: Family Medicine | Admitting: Family Medicine

## 2020-03-22 ENCOUNTER — Inpatient Hospital Stay (HOSPITAL_COMMUNITY)
Admission: AD | Admit: 2020-03-22 | Discharge: 2020-03-22 | Disposition: A | Discharge status: Home / Self Care | Payer: Medicaid Other | Attending: Family Medicine | Admitting: Family Medicine

## 2020-03-22 ENCOUNTER — Encounter (HOSPITAL_COMMUNITY): Payer: Self-pay | Admitting: Obstetrics and Gynecology

## 2020-03-22 ENCOUNTER — Other Ambulatory Visit: Payer: Self-pay

## 2020-03-22 ENCOUNTER — Encounter (HOSPITAL_COMMUNITY): Payer: Self-pay | Admitting: *Deleted

## 2020-03-22 ENCOUNTER — Inpatient Hospital Stay (HOSPITAL_COMMUNITY): Payer: Medicaid Other

## 2020-03-22 DIAGNOSIS — Z3A00 Weeks of gestation of pregnancy not specified: Secondary | ICD-10-CM | POA: Diagnosis not present

## 2020-03-22 DIAGNOSIS — Z87891 Personal history of nicotine dependence: Secondary | ICD-10-CM | POA: Insufficient documentation

## 2020-03-22 DIAGNOSIS — O209 Hemorrhage in early pregnancy, unspecified: Secondary | ICD-10-CM | POA: Insufficient documentation

## 2020-03-22 DIAGNOSIS — N8311 Corpus luteum cyst of right ovary: Secondary | ICD-10-CM | POA: Diagnosis not present

## 2020-03-22 DIAGNOSIS — O4691 Antepartum hemorrhage, unspecified, first trimester: Secondary | ICD-10-CM

## 2020-03-22 DIAGNOSIS — O4690 Antepartum hemorrhage, unspecified, unspecified trimester: Secondary | ICD-10-CM | POA: Diagnosis not present

## 2020-03-22 DIAGNOSIS — Z3A01 Less than 8 weeks gestation of pregnancy: Secondary | ICD-10-CM | POA: Diagnosis not present

## 2020-03-22 LAB — CBC
HCT: 40 % (ref 36.0–46.0)
Hemoglobin: 13.5 g/dL (ref 12.0–15.0)
MCH: 30.2 pg (ref 26.0–34.0)
MCHC: 33.8 g/dL (ref 30.0–36.0)
MCV: 89.5 fL (ref 80.0–100.0)
Platelets: 306 K/uL (ref 150–400)
RBC: 4.47 MIL/uL (ref 3.87–5.11)
RDW: 11.7 % (ref 11.5–15.5)
WBC: 8.9 K/uL (ref 4.0–10.5)
nRBC: 0 % (ref 0.0–0.2)

## 2020-03-22 LAB — ABO/RH: ABO/RH(D): A POS

## 2020-03-22 LAB — URINALYSIS, ROUTINE W REFLEX MICROSCOPIC
Bilirubin Urine: NEGATIVE
Glucose, UA: NEGATIVE mg/dL
Hgb urine dipstick: NEGATIVE
Ketones, ur: NEGATIVE mg/dL
Nitrite: NEGATIVE
Protein, ur: NEGATIVE mg/dL
Specific Gravity, Urine: 1.02 (ref 1.005–1.030)
pH: 7 (ref 5.0–8.0)

## 2020-03-22 LAB — POC URINE PREG, ED: Preg Test, Ur: POSITIVE — AB

## 2020-03-22 LAB — HCG, QUANTITATIVE, PREGNANCY: hCG, Beta Chain, Quant, S: 32600 m[IU]/mL — ABNORMAL HIGH

## 2020-03-22 NOTE — ED Provider Notes (Signed)
[facility]    CSN: [id number] Arrival date & time: 03/22/20  1052      History   Chief Complaint Chief Complaint  Patient presents with  . Possible Pregnancy    HPI Erika Bailey is a 20 y.o. female otherwise healthy presents to urgent care today with complaints of vaginal bleeding.  Patient reports positive home pregnancy test 1/1.  Began heavy bleeding last evening with abdominal cramping.  States today more like bleeding with regular menses.  At present denies any abdominal pain, abdominal cramping, vomiting, recent fever or chills.  Pregnancy verified with urine pregnancy test.  We will send patient to MAU for further evaluation   History reviewed. No pertinent past medical history.  Patient Active Problem List   Diagnosis Date Noted  . Encounter for induction of labor 10/06/2019  . Group B Streptococcus carrier, +RV culture, currently pregnant 09/30/2019  . 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 Supervision of other normal pregnancy, antepartum 02/10/2019    Past Surgical History:  Procedure Laterality Date  . DILATION AND EVACUATION N/A 01/21/2018   Procedure: DILATATION AND EVACUATION;  Surgeon: Adam Phenix, MD;  Location: [facility];  Service: Gynecology;  Laterality: N/A;  . NO PAST SURGERIES      OB History    Gravida  2   Para  1   Term  1   Preterm      AB  1   Living  1     SAB  1   IAB      Ectopic      Multiple  0   Live Births  1            Home Medications    Prior to Admission medications   Medication Sig Start Date End Date Taking? Authorizing Provider  acetaminophen (TYLENOL) 325 MG tablet Take 2 tablets (650 mg total) by mouth every 6 (six) hours. 10/08/19   Sheila Oats, MD  coconut oil OIL Apply 1 application topically as needed. 10/08/19   Sheila Oats, MD  diphenhydrAMINE (BENADRYL) 25 mg capsule Take 1 capsule (25 mg total) by mouth every 6 (six) hours as needed for itching. 10/08/19    Sheila Oats, MD  Elastic Bandages & Supports (COMFORT FIT MATERNITY SUPP SM) MISC Wear as directed. Patient not taking: No sig reported 08/02/19   Brock Bad, MD  ibuprofen (ADVIL) 600 MG tablet Take 1 tablet (600 mg total) by mouth every 8 (eight) hours as needed for mild pain. 10/08/19   Sheila Oats, MD  norethindrone (ORTHO MICRONOR) 0.35 MG tablet Take 1 tablet (0.35 mg total) by mouth daily. 10/08/19 10/07/20  Sheila Oats, MD  Prenatal Vit-Fe Fumarate-FA (PREPLUS) 27-1 MG TABS Take 1 tablet by mouth daily. 04/15/19   Brock Bad, MD  senna-docusate (SENOKOT-S) 8.6-50 MG tablet Take 2 tablets by mouth daily. 10/09/19   Sheila Oats, MD  simethicone (MYLICON) 80 MG chewable tablet Chew 1 tablet (80 mg total) by mouth as needed for flatulence. 10/08/19   Sheila Oats, MD  medroxyPROGESTERone (DEPO-PROVERA) 150 MG/ML injection Inject 1 mL (150 mg total) into the muscle every 3 (three) months. 02/10/18 11/05/18  Adam Phenix, MD    Family History Family History  Problem Relation Age of Onset  . Healthy Mother   . Healthy Father   . Cancer Maternal Grandmother     Social History Social History   Tobacco Use  .  Smoking status: Former Smoker    Quit date: 01/24/2019    Years since quitting: 1.1  . Smokeless tobacco: Never Used  Vaping Use  . Vaping Use: Never used  Substance Use Topics  . Alcohol use: Not Currently  . Drug use: Never     Allergies   Peanuts [peanut oil]   Review of Systems As stated in HPI otherwise negative   Physical Exam Triage Vital Signs ED Triage Vitals  Enc Vitals Group     BP 03/22/20 1156 (!) 106/59     Pulse Rate 03/22/20 1156 74     Resp 03/22/20 1156 18     Temp 03/22/20 1156 98.9 F (37.2 C)     Temp Source 03/22/20 1156 Oral     SpO2 03/22/20 1156 100 %     Weight --      Height --      Head Circumference --      Peak Flow --      Pain Score 03/22/20 1152 0     Pain Loc --      Pain Edu? --      Excl. in  GC? --    No data found.  Updated Vital Signs BP (!) 106/59 (BP Location: Left Arm)   Pulse 74   Temp 98.9 F (37.2 C) (Oral)   Resp 18   LMP 03/21/2020   SpO2 100%   Visual Acuity Right Eye Distance:   Left Eye Distance:   Bilateral Distance:    Right Eye Near:   Left Eye Near:    Bilateral Near:     Physical Exam Constitutional:      General: She is not in acute distress.    Appearance: Normal appearance. She is not ill-appearing or toxic-appearing.  Neurological:     Mental Status: She is alert.  Psychiatric:        Mood and Affect: Mood normal.        Behavior: Behavior normal.      UC Treatments / Results  Labs (all labs ordered are listed, but only abnormal results are displayed) Labs Reviewed  POC URINE PREG, ED - Abnormal; Notable for the following components:      Result Value   Preg Test, Ur POSITIVE (*)    All other components within normal limits    EKG   Radiology No results found.  Procedures Procedures (including critical care time)  Medications Ordered in UC Medications - No data to display  Initial Impression / Assessment and Plan / UC Course  I have reviewed the triage vital signs and the nursing notes.  Pertinent labs & imaging results that were available during my care of the patient were reviewed by me and considered in my medical decision making (see chart for details).  Vaginal bleeding in pregnancy Pregnancy verified with urine pregnancy test.  Onset vaginal bleeding and cramping last evening.  Patient denies any dizziness, chest pain or shortness of breath.  Felt stable to discharge from urgent care to take personal vehicle to MAU for further evaluation.  Patient verifies understanding.  Reviewed expections re: course of current medical issues. Questions answered. Outlined signs and symptoms indicating need for more acute intervention. Pt verbalized understanding. AVS given  Final Clinical Impressions(s) / UC Diagnoses    Final diagnoses:  Vaginal bleeding in pregnancy     Discharge Instructions     Please go from here to Maternity Admissions for further evaluation.     ED Prescriptions  None     PDMP not reviewed this encounter.   Rolla Etienne, NP 03/22/20 1236

## 2020-03-22 NOTE — MAU Provider Note (Signed)
Chief Complaint:  Vaginal Bleeding   Event Date/Time   First Provider Initiated Contact with Patient 03/22/20 1326     HPI: Erika Bailey is a 20 y.o. G3P1011 at [redacted]w[redacted]d who presents to maternity admissions reporting heavy vaginal bleeding and cramping (like a period) since last night. She had a positive UPT on 03/12/19, LMP is 02/08/20.  Last IC was before she found out she was pregnant. She denies any abnormal vaginal discharge or recent illness. No other complaints and she has not established OB care anywhere yet.  History reviewed. No pertinent past medical history. OB History  Gravida Para Term Preterm AB Living  3 1 1   1 1   SAB IAB Ectopic Multiple Live Births  1     0 1    # Outcome Date GA Lbr Len/2nd Weight Sex Delivery Anes PTL Lv  3 Current           2 Term 10/07/19 [redacted]w[redacted]d 03:49 / 01:41 8 lb 5.3 oz (3.78 kg) M Vag-Spont EPI  LIV     Birth Comments: extra digit on both hands  1 SAB            Past Surgical History:  Procedure Laterality Date  . DILATION AND EVACUATION N/A 01/21/2018   Procedure: DILATATION AND EVACUATION;  Surgeon: 01/23/2018, MD;  Location: [facility];  Service: Gynecology;  Laterality: N/A;  . NO PAST SURGERIES     Family History  Problem Relation Age of Onset  . Healthy Mother   . Healthy Father   . Cancer Maternal Grandmother    Social History   Tobacco Use  . Smoking status: Former Smoker    Quit date: 01/24/2019    Years since quitting: 1.1  . Smokeless tobacco: Never Used  Vaping Use  . Vaping Use: Never used  Substance Use Topics  . Alcohol use: Not Currently  . Drug use: Never   Allergies  Allergen Reactions  . Peanuts [Peanut Oil] Itching and Swelling   No medications prior to admission.    I have reviewed patient's Past Medical Hx, Surgical Hx, Family Hx, Social Hx, medications and allergies.   ROS:  Review of Systems  Gastrointestinal: Positive for abdominal pain.  Genitourinary: Positive for vaginal bleeding.  All other  systems reviewed and are negative.   Physical Exam   No data found. Constitutional: Well-developed, well-nourished female in no acute distress.  Cardiovascular: normal rate & rhythm, no murmur Respiratory: normal effort, lung sounds clear throughout GI: Abd soft, non-tender, gravid appropriate for gestational age. Pos BS x 4 MS: Extremities nontender, no edema, normal ROM Neurologic: Alert and oriented x 4.  GU: no CVA tenderness Pelvic exam deferred  Labs: No results found for this or any previous visit (from the past 24 hour(s)).  Imaging:  CLINICAL DATA:  Vaginal bleeding, first trimester of pregnancy.  EXAM: OBSTETRIC <14 WK 01/26/2019 AND TRANSVAGINAL OB US  TECHNIQUE: Both transabdominal and transvaginal ultrasound examinations were performed for complete evaluation of the gestation as well as the maternal uterus, adnexal regions, and pelvic cul-de-sac. Transvaginal technique was performed to assess early pregnancy.  COMPARISON:  None.  FINDINGS: Intrauterine gestational sac: 2 irregular cystic areas are noted in the endometrium, but it is uncertain if they represent a gestational sac  Yolk sac:  Not Visualized.  Embryo:  Not Visualized.  Cardiac Activity: Not Visualized.  Maternal uterus/adnexae: Corpus luteum cyst seen in right ovary. Left ovary appears normal. No free fluid is noted.  IMPRESSION: No definite intrauterine gestational sac, yolk sac, fetal pole, or cardiac activity visualized. Differential considerations include intrauterine gestation too early to be sonographically visualized, spontaneous abortion, or ectopic pregnancy. Consider follow-up ultrasound in 14 days and serial quantitative beta HCG follow-up.   Electronically Signed   By: Lupita Raider M.D.   On: 03/22/2020 14:26  MAU Course: Orders Placed This Encounter  Procedures  . US OB LESS THAN 14 WEEKS WITH OB TRANSVAGINAL  . Urinalysis, Routine w reflex microscopic Urine,  Clean Catch  . CBC  . hCG, quantitative, pregnancy  . ABO/Rh  . Discharge patient   No orders of the defined types were placed in this encounter.  MDM: Ectopic workup ordered - labs normal U/S results discussed with pt, advised to have repeat bloodwork in two days which will hopefully give [place] a more definitive diagnosis. Patient amenable to plan.  Reviewed bleeding precautions for return to MAU if excessive bleeding and course of treatment if this is a miscarriage and does not completely resolve on its own (she has had a D&C for incomplete SAB prior).  Assessment: 1. Vaginal bleeding affecting early pregnancy     Plan: Discharge home in stable condition with bleeding precautions. Message sent to [facility] for scheduling of lab appointment.  Follow-up Information    [facility]. Go on 03/24/2020.   Specialty: Obstetrics and Gynecology Why: for repeat bloodwork Contact information: [address] [place] [address] [phone]              Allergies as of 03/22/2020      Reactions   Peanuts [peanut Oil] Itching, Swelling      Medication List    TAKE these medications   acetaminophen 325 MG tablet Commonly known as: Tylenol Take 2 tablets (650 mg total) by mouth every 6 (six) hours.   Comfort Fit Maternity Supp Sm Misc Wear as directed.   norethindrone 0.35 MG tablet Commonly known as: Ortho Micronor Take 1 tablet (0.35 mg total) by mouth daily.     ASK your doctor about these medications   coconut oil Oil Apply 1 application topically as needed.   diphenhydrAMINE 25 mg capsule Commonly known as: BENADRYL Take 1 capsule (25 mg total) by mouth every 6 (six) hours as needed for itching.   ibuprofen 600 MG tablet Commonly known as: ADVIL Take 1 tablet (600 mg total) by mouth every 8 (eight) hours as needed for mild pain.   PrePLUS 27-1 MG Tabs Take 1 tablet by mouth daily.   senna-docusate  8.6-50 MG tablet Commonly known as: Senokot-S Take 2 tablets by mouth daily.   simethicone 80 MG chewable tablet Commonly known as: MYLICON Chew 1 tablet (80 mg total) by mouth as needed for flatulence.      Edd Arbour, CNM, MSN, IBCLC Certified Nurse Midwife, [facility]

## 2020-03-22 NOTE — ED Triage Notes (Signed)
PT reports a positive Preg test 03-11-20 and has her menses now. Pt wants a Pregnancy test.

## 2020-03-22 NOTE — Discharge Instructions (Addendum)
Please go from here to Maternity Admissions for further evaluation.

## 2020-03-22 NOTE — ED Notes (Signed)
Patient is being discharged from the Urgent Care and sent to the Emergency Department via personal vehicle . Per Provider Blondell Reveal, patient is in need of higher level of care due to vaginal bleeding in pregnancy. Patient is aware and verbalizes understanding of plan of care.   Vitals:   03/22/20 1156  BP: (!) 106/59  Pulse: 74  Resp: 18  Temp: 98.9 F (37.2 C)  SpO2: 100%

## 2020-03-22 NOTE — MAU Note (Addendum)
Presents with c/o VB that began last night.  Reports bleeding is like a menstrual cycle.  LMP 02/08/2020.  +HPT and +UPT @ Urgent Care.

## 2020-03-22 NOTE — Discharge Instructions (Signed)
Vaginal Bleeding During Pregnancy, First Trimester A small amount of bleeding from the vagina is common during early pregnancy. This kind of bleeding is also called spotting. Sometimes the bleeding is normal and does not cause problems. At other times, though, bleeding may be a sign of something serious. Normal bleeding in pregnancy can happen:  When the fertilized egg attaches itself to your womb.  When blood vessels change because of the pregnancy.  When you have pelvic exams.  When you have sex. Abnormal bleeding can happen:  When you have an infection.  When you have growths in your womb. The growths are called polyps.  If you are having a miscarriage or at risk of having one.  If you have other problems in your pregnancy. Tell your doctor right away about any bleeding from your vagina. Follow these instructions at home: Watch your bleeding  Watch your condition for any changes. Let your doctor know if you are worried about something.  Try to know what causes your bleeding. Ask yourself these questions: ? Does the bleeding start on its own? ? Does the bleeding start after something is done, such as sex or a pelvic exam?  Use a diary to write the things you see about your bleeding. Write in your diary: ? If the bleeding flows freely without stopping, or if it starts and stops, and then starts again. ? If the bleeding is heavy or light. ? How many pads you use in a day and how much blood is in them.  Tell your doctor if you pass tissue. He or she may want to see it.   Activity  Follow your doctor's instructions about how active you can be. Ask what activities are safe for you.  Do not have sex or orgasms until your doctor says that this is safe.  If needed, make plans for someone to help with your normal activities. General instructions  Take over-the-counter and prescription medicines only as told by your doctor.  Do not take aspirin because it can cause  bleeding.  Do not use tampons.  Do not douche.  Keep all follow-up visits. Contact a doctor if:  You have vaginal bleeding at any time while you are pregnant.  You have cramps.  You have a fever or chills. Get help right away if:  You have very bad cramps in your back or belly (abdomen).  You pass large clots or a lot of tissue from your vagina.  Your bleeding gets worse.  You feel light-headed.  You feel weak.  You pass out (faint).  You have chills.  You are leaking fluid from your vagina.  You have a gush of fluid from your vagina. Summary  Sometimes vaginal bleeding during pregnancy is normal and does not cause problems. At other times, bleeding may be a sign of something serious.  Tell your doctor right away about any bleeding from your vagina.  Follow your doctor's instructions about how active you can be. You may need someone to help you with your normal activities.  Keep all follow-up visits. This information is not intended to replace advice given to you by your health care provider. Make sure you discuss any questions you have with your health care provider. Document Revised: 11/18/2019 Document Reviewed: 11/18/2019 Elsevier Patient Education  2021 [employer] Inc.  

## 2020-03-30 ENCOUNTER — Other Ambulatory Visit: Payer: Medicaid Other

## 2020-04-14 ENCOUNTER — Ambulatory Visit (HOSPITAL_COMMUNITY)
Admission: EM | Admit: 2020-04-14 | Discharge: 2020-04-14 | Disposition: A | Discharge status: Home / Self Care | Payer: Medicaid Other | Attending: Emergency Medicine | Admitting: Emergency Medicine

## 2020-04-14 ENCOUNTER — Other Ambulatory Visit: Payer: Self-pay

## 2020-04-14 ENCOUNTER — Encounter (HOSPITAL_COMMUNITY): Payer: Self-pay | Admitting: Emergency Medicine

## 2020-04-14 DIAGNOSIS — Z0189 Encounter for other specified special examinations: Secondary | ICD-10-CM

## 2020-04-14 DIAGNOSIS — Z202 Contact with and (suspected) exposure to infections with a predominantly sexual mode of transmission: Secondary | ICD-10-CM

## 2020-04-14 DIAGNOSIS — Z3201 Encounter for pregnancy test, result positive: Secondary | ICD-10-CM | POA: Diagnosis not present

## 2020-04-14 LAB — HCG, QUANTITATIVE, PREGNANCY: hCG, Beta Chain, Quant, S: 79011 m[IU]/mL — ABNORMAL HIGH (ref <5)

## 2020-04-14 LAB — POC URINE PREG, ED: Preg Test, Ur: POSITIVE — AB

## 2020-04-14 MED ORDER — AZITHROMYCIN 250 MG PO TABS
1000.0000 mg | ORAL_TABLET | Freq: Once | ORAL | Status: AC
  Administered 2020-04-14: 1000 mg via ORAL

## 2020-04-14 MED ORDER — AZITHROMYCIN 250 MG PO TABS
ORAL_TABLET | ORAL | Status: AC
  Filled 2020-04-14: qty 4

## 2020-04-14 NOTE — ED Triage Notes (Signed)
Pt presents with vaginal discharge and back pain xs 1 weeks. States was exposed to STD. Pt would also like to get pregnancy test, states she recently had miscarriage but has not followed back with GYN.

## 2020-04-14 NOTE — ED Provider Notes (Signed)
[facility]    CSN: [id number] Arrival date & time: 04/14/20  1722      History   Chief Complaint Chief Complaint  Patient presents with  . Exposure to STD  . Vaginal Discharge  . Back Pain    HPI Erika Bailey is a 20 y.o. female.   Erika Bailey presents with complaints of vaginal discharge and some low back pain. She was notified today that her partner tested positive for chlamydia. She has had in the past as well and feels similar. No pelvic pain. No vaginal bleeding. No fevers or body aches. Also requesting hcg recheck. She was seen in the [facility] on 1/12 due to vaginal bleeding and positive home test. There was cystic structures to the uterus but no visible intrauterine pregnancy/ pole/ yoke sac, or fetal cardiac activity. She was scheduled to have a recheck of hcg to confirm concern for miscarriage. She bled another day, it stopped, then she bled around 3 more days. She states that her pregnancy symptoms have been decreasing. She missed her follow up appointment so has not yet again seen OB. She has a 22 month old at home.    ROS per HPI, negative if not otherwise mentioned.      History reviewed. No pertinent past medical history.  Patient Active Problem List   Diagnosis Date Noted  . Encounter for induction of labor 10/06/2019  . Group B Streptococcus carrier, +RV culture, currently pregnant 09/30/2019  . 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 Supervision of other normal pregnancy, antepartum 02/10/2019    Past Surgical History:  Procedure Laterality Date  . DILATION AND EVACUATION N/A 01/21/2018   Procedure: DILATATION AND EVACUATION;  Surgeon: Adam Phenix, MD;  Location: [facility];  Service: Gynecology;  Laterality: N/A;  . NO PAST SURGERIES      OB History    Gravida  3   Para  1   Term  1   Preterm      AB  1   Living  1     SAB  1   IAB      Ectopic      Multiple  0   Live Births  1            Home  Medications    Prior to Admission medications   Medication Sig Start Date End Date Taking? Authorizing Provider  acetaminophen (TYLENOL) 325 MG tablet Take 2 tablets (650 mg total) by mouth every 6 (six) hours. 10/08/19   Sheila Oats, MD  coconut oil OIL Apply 1 application topically as needed. 10/08/19   Sheila Oats, MD  diphenhydrAMINE (BENADRYL) 25 mg capsule Take 1 capsule (25 mg total) by mouth every 6 (six) hours as needed for itching. 10/08/19   Sheila Oats, MD  Elastic Bandages & Supports (COMFORT FIT MATERNITY SUPP SM) MISC Wear as directed. Patient not taking: No sig reported 08/02/19   Brock Bad, MD  ibuprofen (ADVIL) 600 MG tablet Take 1 tablet (600 mg total) by mouth every 8 (eight) hours as needed for mild pain. 10/08/19   Sheila Oats, MD  norethindrone (ORTHO MICRONOR) 0.35 MG tablet Take 1 tablet (0.35 mg total) by mouth daily. 10/08/19 10/07/20  Sheila Oats, MD  Prenatal Vit-Fe Fumarate-FA (PREPLUS) 27-1 MG TABS Take 1 tablet by mouth daily. 04/15/19   Brock Bad, MD  senna-docusate (SENOKOT-S) 8.6-50 MG tablet Take 2 tablets by mouth daily.  10/09/19   Sheila Oats, MD  simethicone (MYLICON) 80 MG chewable tablet Chew 1 tablet (80 mg total) by mouth as needed for flatulence. 10/08/19   Sheila Oats, MD  medroxyPROGESTERone (DEPO-PROVERA) 150 MG/ML injection Inject 1 mL (150 mg total) into the muscle every 3 (three) months. 02/10/18 11/05/18  Adam Phenix, MD    Family History Family History  Problem Relation Age of Onset  . Healthy Mother   . Healthy Father   . Cancer Maternal Grandmother     Social History Social History   Tobacco Use  . Smoking status: Former Smoker    Quit date: 01/24/2019    Years since quitting: 1.2  . Smokeless tobacco: Never Used  Vaping Use  . Vaping Use: Never used  Substance Use Topics  . Alcohol use: Not Currently  . Drug use: Never     Allergies   Peanuts [peanut oil]   Review of Systems Review  of Systems   Physical Exam Triage Vital Signs ED Triage Vitals  Enc Vitals Group     BP 04/14/20 1755 110/65     Pulse Rate 04/14/20 1755 79     Resp 04/14/20 1755 16     Temp 04/14/20 1755 98.5 F (36.9 C)     Temp Source 04/14/20 1755 Oral     SpO2 04/14/20 1755 96 %     Weight --      Height --      Head Circumference --      Peak Flow --      Pain Score 04/14/20 1752 3     Pain Loc --      Pain Edu? --      Excl. in GC? --    No data found.  Updated Vital Signs BP 110/65 (BP Location: Left Arm)   Pulse 79   Temp 98.5 F (36.9 C) (Oral)   Resp 16   LMP 02/08/2020   SpO2 96%   Breastfeeding Unknown   Visual Acuity Right Eye Distance:   Left Eye Distance:   Bilateral Distance:    Right Eye Near:   Left Eye Near:    Bilateral Near:     Physical Exam Constitutional:      General: She is not in acute distress.    Appearance: She is well-developed.  Cardiovascular:     Rate and Rhythm: Normal rate.  Pulmonary:     Effort: Pulmonary effort is normal.  Abdominal:     Palpations: Abdomen is not rigid.     Tenderness: There is no abdominal tenderness. There is no guarding or rebound.  Genitourinary:    Comments: Denies sores, lesions, vaginal bleeding; no pelvic pain; gu exam deferred at this time, vaginal self swab collected.   Skin:    General: Skin is warm and dry.  Neurological:     Mental Status: She is alert and oriented to person, place, and time.      UC Treatments / Results  Labs (all labs ordered are listed, but only abnormal results are displayed) Labs Reviewed  POC URINE PREG, ED - Abnormal; Notable for the following components:      Result Value   Preg Test, Ur POSITIVE (*)    All other components within normal limits  HCG, QUANTITATIVE, PREGNANCY  CERVICOVAGINAL ANCILLARY ONLY    EKG   Radiology No results found.  Procedures Procedures (including critical care time)  Medications Ordered in UC Medications  azithromycin  (ZITHROMAX) tablet 1,000 mg (  1,000 mg Oral Given 04/14/20 1829)    Initial Impression / Assessment and Plan / UC Course  I have reviewed the triage vital signs and the nursing notes.  Pertinent labs & imaging results that were available during my care of the patient were reviewed by me and considered in my medical decision making (see chart for details).     Chlamydia treatment provided here today, azithromycin, as concern for potential pregnancy still. hcg quantitative collected and pending as it was 32,600 on 1/12. Discussed with patient what we will be evaluating with either increase or decrease in HCG. Encouraged follow up with OB next week. Educated to go to [facility] for any pain, fevers, increased discharge or bleeding. Patient verbalized understanding and agreeable to plan.   Final Clinical Impressions(s) / UC Diagnoses   Final diagnoses:  Exposure to chlamydia  Encounter for laboratory test  Positive pregnancy test     Discharge Instructions     We have treated you today for chlamydia.  We will notify of you any positive findings from your vaginal testing, or if any changes to treatment are needed. If normal or otherwise without concern to your results, we will not call you. Please log on to your MyChart to review your results if interested in so.   Please withhold from intercourse for the next week. Please use condoms to prevent STD's.   Please check your mychart as I will make note your blood test for HCG in regards to pregnancy.  If it is higher than suspect continued pregnancy or potentially retained products.  If decreased suspect miscarriage.  If you develop pain or fevers please go to [facility] hospital.  Please follow up with OB next week.    ED Prescriptions    None     PDMP not reviewed this encounter.   Georgetta Haber, NP 04/14/20 [phone]

## 2020-04-14 NOTE — Discharge Instructions (Signed)
We have treated you today for chlamydia.  We will notify of you any positive findings from your vaginal testing, or if any changes to treatment are needed. If normal or otherwise without concern to your results, we will not call you. Please log on to your MyChart to review your results if interested in so.   Please withhold from intercourse for the next week. Please use condoms to prevent STD's.   Please check your mychart as I will make note your blood test for HCG in regards to pregnancy.  If it is higher than suspect continued pregnancy or potentially retained products.  If decreased suspect miscarriage.  If you develop pain or fevers please go to [facility] hospital.  Please follow up with OB next week.

## 2020-04-17 ENCOUNTER — Other Ambulatory Visit: Payer: Self-pay | Admitting: Obstetrics and Gynecology

## 2020-04-17 DIAGNOSIS — Z3490 Encounter for supervision of normal pregnancy, unspecified, unspecified trimester: Secondary | ICD-10-CM

## 2020-04-17 LAB — CERVICOVAGINAL ANCILLARY ONLY
Bacterial Vaginitis (gardnerella): POSITIVE — AB
Candida Glabrata: NEGATIVE
Candida Vaginitis: NEGATIVE
Chlamydia: POSITIVE — AB
Comment: NEGATIVE
Comment: NEGATIVE
Comment: NEGATIVE
Comment: NEGATIVE
Comment: NEGATIVE
Comment: NORMAL
Neisseria Gonorrhea: NEGATIVE
Trichomonas: NEGATIVE

## 2020-04-19 ENCOUNTER — Other Ambulatory Visit: Payer: Self-pay | Admitting: Obstetrics

## 2020-04-19 DIAGNOSIS — Z3480 Encounter for supervision of other normal pregnancy, unspecified trimester: Secondary | ICD-10-CM

## 2020-05-04 ENCOUNTER — Ambulatory Visit: Admission: RE | Admit: 2020-05-04 | Payer: Medicaid Other | Source: Ambulatory Visit

## 2020-06-14 ENCOUNTER — Encounter (HOSPITAL_COMMUNITY): Payer: Self-pay

## 2020-06-14 ENCOUNTER — Ambulatory Visit (HOSPITAL_COMMUNITY)
Admission: EM | Admit: 2020-06-14 | Discharge: 2020-06-14 | Disposition: A | Discharge status: Home / Self Care | Payer: Medicaid Other | Attending: Family Medicine | Admitting: Family Medicine

## 2020-06-14 ENCOUNTER — Other Ambulatory Visit: Payer: Self-pay

## 2020-06-14 DIAGNOSIS — R1030 Lower abdominal pain, unspecified: Secondary | ICD-10-CM | POA: Diagnosis not present

## 2020-06-14 DIAGNOSIS — N760 Acute vaginitis: Secondary | ICD-10-CM | POA: Insufficient documentation

## 2020-06-14 LAB — POCT URINALYSIS DIPSTICK, ED / UC
Bilirubin Urine: NEGATIVE
Glucose, UA: NEGATIVE mg/dL
Hgb urine dipstick: NEGATIVE
Nitrite: NEGATIVE
Protein, ur: NEGATIVE mg/dL
Specific Gravity, Urine: >=1.03 (ref 1.005–1.030)
Urobilinogen, UA: 1 mg/dL (ref 0.0–1.0)
pH: 5.5 (ref 5.0–8.0)

## 2020-06-14 LAB — POC URINE PREG, ED: Preg Test, Ur: NEGATIVE

## 2020-06-14 MED ORDER — METRONIDAZOLE 500 MG PO TABS: 500.0000 mg | ORAL_TABLET | Freq: Two times a day (BID) | ORAL | 0 refills | Status: DC

## 2020-06-14 NOTE — ED Provider Notes (Signed)
[facility]    CSN: [id number] Arrival date & time: 06/14/20  1521      History   Chief Complaint Chief Complaint  Patient presents with  . BV  . Abdominal Pain  . Back Pain    HPI Erika Bailey is a 20 y.o. female.   Patient presenting today with vaginal discharge, irritation, odor, intermittent suprapubic pains the past few days.  She states she has been trying boric acid over-the-counter without any relief.  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  LMP 05/30/2020.  She states she suffered a miscarriage last month but has had no issues since then with her cycles.  No new sexual partners, no rashes, no fevers chills nausea vomiting diarrhea or other new symptoms.     History reviewed. No pertinent past medical history.  Patient Active Problem List   Diagnosis Date Noted  . Encounter for induction of labor 10/06/2019  . Group B Streptococcus carrier, +RV culture, currently pregnant 09/30/2019  . 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 Supervision of other normal pregnancy, antepartum 02/10/2019    Past Surgical History:  Procedure Laterality Date  . DILATION AND EVACUATION N/A 01/21/2018   Procedure: DILATATION AND EVACUATION;  Surgeon: Adam Phenix, MD;  Location: [facility];  Service: Gynecology;  Laterality: N/A;  . NO PAST SURGERIES      OB History    Gravida  3   Para  1   Term  1   Preterm      AB  1   Living  1     SAB  1   IAB      Ectopic      Multiple  0   Live Births  1            Home Medications    Prior to Admission medications   Medication Sig Start Date End Date Taking? Authorizing Provider  metroNIDAZOLE (FLAGYL) 500 MG tablet Take 1 tablet (500 mg total) by mouth 2 (two) times daily. 06/14/20  Yes Particia Nearing, PA-C  acetaminophen (TYLENOL) 325 MG tablet Take 2 tablets (650 mg total) by mouth every 6 (six) hours. 10/08/19   Sheila Oats, MD  coconut oil OIL Apply  1 application topically as needed. 10/08/19   Sheila Oats, MD  diphenhydrAMINE (BENADRYL) 25 mg capsule Take 1 capsule (25 mg total) by mouth every 6 (six) hours as needed for itching. 10/08/19   Sheila Oats, MD  Elastic Bandages & Supports (COMFORT FIT MATERNITY SUPP SM) MISC Wear as directed. Patient not taking: No sig reported 08/02/19   Brock Bad, MD  ibuprofen (ADVIL) 600 MG tablet Take 1 tablet (600 mg total) by mouth every 8 (eight) hours as needed for mild pain. 10/08/19   Sheila Oats, MD  norethindrone (ORTHO MICRONOR) 0.35 MG tablet Take 1 tablet (0.35 mg total) by mouth daily. 10/08/19 10/07/20  Sheila Oats, MD  Prenatal 27-1 MG TABS TAKE 1 TABLET BY MOUTH DAILY 04/19/20   Brock Bad, MD  senna-docusate (SENOKOT-S) 8.6-50 MG tablet Take 2 tablets by mouth daily. 10/09/19   Sheila Oats, MD  simethicone (MYLICON) 80 MG chewable tablet Chew 1 tablet (80 mg total) by mouth as needed for flatulence. 10/08/19   Sheila Oats, MD  medroxyPROGESTERone (DEPO-PROVERA) 150 MG/ML injection Inject 1 mL (150 mg total) into the muscle every 3 (three) months. 02/10/18 11/05/18  Debroah Loop,  Currie Paris, MD    Family History Family History  Problem Relation Age of Onset  . Healthy Mother   . Healthy Father   . Cancer Maternal Grandmother     Social History Social History   Tobacco Use  . Smoking status: Former Smoker    Quit date: 01/24/2019    Years since quitting: 1.3  . Smokeless tobacco: Never Used  Vaping Use  . Vaping Use: Never used  Substance Use Topics  . Alcohol use: Not Currently  . Drug use: Never     Allergies   Peanuts [peanut oil]   Review of Systems Review of Systems Per HPI Physical Exam Triage Vital Signs ED Triage Vitals  Enc Vitals Group     BP 06/14/20 1542 110/62     Pulse Rate 06/14/20 1540 89     Resp 06/14/20 1540 17     Temp 06/14/20 1540 97.7 F (36.5 C)     Temp src --      SpO2 06/14/20 1540 100 %     Weight --      Height  --      Head Circumference --      Peak Flow --      Pain Score 06/14/20 1539 0     Pain Loc --      Pain Edu? --      Excl. in GC? --    No data found.  Updated Vital Signs BP 110/62   Pulse 89   Temp 97.7 F (36.5 C)   Resp 17   LMP 05/31/2020   SpO2 100%   Breastfeeding No   Visual Acuity Right Eye Distance:   Left Eye Distance:   Bilateral Distance:    Right Eye Near:   Left Eye Near:    Bilateral Near:     Physical Exam Vitals and nursing note reviewed.  Constitutional:      Appearance: Normal appearance. She is not ill-appearing.  HENT:     Head: Atraumatic.     Mouth/Throat:     Mouth: Mucous membranes are moist.     Pharynx: Oropharynx is clear.  Eyes:     Extraocular Movements: Extraocular movements intact.     Conjunctiva/sclera: Conjunctivae normal.  Cardiovascular:     Rate and Rhythm: Normal rate and regular rhythm.     Heart sounds: Normal heart sounds.  Pulmonary:     Effort: Pulmonary effort is normal.     Breath sounds: Normal breath sounds.  Abdominal:     General: Bowel sounds are normal. There is no distension.     Palpations: Abdomen is soft.     Tenderness: There is no abdominal tenderness. There is no right CVA tenderness, left CVA tenderness or guarding.  Genitourinary:    Comments: GU exam deferred, self swab performed Musculoskeletal:        General: Normal range of motion.     Cervical back: Normal range of motion and neck supple.  Skin:    General: Skin is warm and dry.  Neurological:     Mental Status: She is alert and oriented to person, place, and time.  Psychiatric:        Mood and Affect: Mood normal.        Thought Content: Thought content normal.        Judgment: Judgment normal.    UC Treatments / Results  Labs (all labs ordered are listed, but only abnormal results are displayed) Labs Reviewed  POCT URINALYSIS  DIPSTICK, ED / UC - Abnormal; Notable for the following components:      Result Value   Ketones, ur  TRACE (*)    Leukocytes,Ua TRACE (*)    All other components within normal limits  URINE CULTURE  POC URINE PREG, ED  CERVICOVAGINAL ANCILLARY ONLY    EKG   Radiology No results found.  Procedures Procedures (including critical care time)  Medications Ordered in UC Medications - No data to display  Initial Impression / Assessment and Plan / UC Course  I have reviewed the triage vital signs and the nursing notes.  Pertinent labs & imaging results that were available during my care of the patient were reviewed by me and considered in my medical decision making (see chart for details).     Exam and vitals very reassuring today, UA with only trace leuks so urine culture ordered and pending.  Urine Preg negative.  Vaginal swab also pending.  Will start metronidazole given her history of recurrent BV and consistent symptoms.  Alter treatment based on results as they come in.  Supportive care and OB/GYN follow-up reviewed.  Return for acutely worsening symptoms  Final Clinical Impressions(s) / UC Diagnoses   Final diagnoses:  Acute vaginitis  Lower abdominal pain   Discharge Instructions   None    ED Prescriptions    Medication Sig Dispense Auth. Provider   metroNIDAZOLE (FLAGYL) 500 MG tablet Take 1 tablet (500 mg total) by mouth 2 (two) times daily. 14 tablet Particia Nearing, [place]     PDMP not reviewed this encounter.   Particia Nearing, [place] 06/14/20 1639

## 2020-06-14 NOTE — ED Triage Notes (Signed)
Pt in with c/o having reoccurring BV. States she was taking boric acid and 2 days later she noticed abdominal and back pain

## 2020-06-15 LAB — CERVICOVAGINAL ANCILLARY ONLY
Bacterial Vaginitis (gardnerella): POSITIVE — AB
Candida Glabrata: NEGATIVE
Candida Vaginitis: NEGATIVE
Chlamydia: NEGATIVE
Comment: NEGATIVE
Comment: NEGATIVE
Comment: NEGATIVE
Comment: NEGATIVE
Comment: NEGATIVE
Comment: NORMAL
Neisseria Gonorrhea: NEGATIVE
Trichomonas: NEGATIVE

## 2020-06-16 LAB — URINE CULTURE

## 2020-10-30 ENCOUNTER — Other Ambulatory Visit: Payer: Self-pay

## 2020-10-30 ENCOUNTER — Ambulatory Visit (HOSPITAL_COMMUNITY)
Admission: EM | Admit: 2020-10-30 | Discharge: 2020-10-30 | Disposition: A | Discharge status: Home / Self Care | Payer: Medicaid Other | Attending: Family Medicine | Admitting: Family Medicine

## 2020-10-30 ENCOUNTER — Encounter (HOSPITAL_COMMUNITY): Payer: Self-pay

## 2020-10-30 DIAGNOSIS — Z711 Person with feared health complaint in whom no diagnosis is made: Secondary | ICD-10-CM | POA: Insufficient documentation

## 2020-10-30 DIAGNOSIS — R350 Frequency of micturition: Secondary | ICD-10-CM | POA: Diagnosis not present

## 2020-10-30 LAB — POCT URINALYSIS DIPSTICK, ED / UC
Bilirubin Urine: NEGATIVE
Glucose, UA: NEGATIVE mg/dL
Hgb urine dipstick: NEGATIVE
Ketones, ur: NEGATIVE mg/dL
Nitrite: NEGATIVE
Protein, ur: NEGATIVE mg/dL
Specific Gravity, Urine: 1.01 (ref 1.005–1.030)
Urobilinogen, UA: 0.2 mg/dL (ref 0.0–1.0)
pH: 6 (ref 5.0–8.0)

## 2020-10-30 LAB — POC URINE PREG, ED: Preg Test, Ur: NEGATIVE

## 2020-10-30 LAB — HIV ANTIBODY (ROUTINE TESTING W REFLEX): HIV Screen 4th Generation wRfx: NONREACTIVE

## 2020-10-30 NOTE — ED Provider Notes (Signed)
[facility]    CSN: [id number] Arrival date & time: 10/30/20  1236      History   Chief Complaint Chief Complaint  Patient presents with   Urinary Frequency    HPI Erika Bailey is a 20 y.o. female.   Patient presenting today with 4 to 5-day history of urinary frequency, suprapubic pressure, occasional diarrhea.  Denies fever, chills, nausea, vomiting, rashes or lesions, hematuria.  She has been taking cranberry juice supplement with no relief so far.  Does note a new sexual partner and concern for STD.  LMP 10/13/2020.   History reviewed. No pertinent past medical history.  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 Authorizing Provider  acetaminophen (TYLENOL) 325 MG tablet Take 2 tablets (650 mg total) by mouth every 6 (six) hours. 10/08/19   Sheila Oats, MD  coconut oil OIL Apply 1 application topically as needed. 10/08/19   Sheila Oats, MD  diphenhydrAMINE (BENADRYL) 25 mg capsule Take 1 capsule (25 mg total) by mouth every 6 (six) hours as needed for itching. 10/08/19   Sheila Oats, MD  Elastic Bandages & Supports (COMFORT FIT MATERNITY SUPP SM) MISC Wear as directed. Patient not  taking: No sig reported 08/02/19   Brock Bad, MD  ibuprofen (ADVIL) 600 MG tablet Take 1 tablet (600 mg total) by mouth every 8 (eight) hours as needed for mild pain. 10/08/19   Sheila Oats, MD  metroNIDAZOLE (FLAGYL) 500 MG tablet Take 1 tablet (500 mg total) by mouth 2 (two) times daily. 06/14/20   Particia Nearing, PA-C  norethindrone (ORTHO MICRONOR) 0.35 MG tablet Take 1 tablet (0.35 mg total) by mouth daily. 10/08/19 10/07/20  Sheila Oats, MD  Prenatal 27-1 MG TABS TAKE 1 TABLET BY MOUTH DAILY 04/19/20   Brock Bad, MD  senna-docusate (SENOKOT-S) 8.6-50 MG tablet Take 2 tablets by mouth daily. 10/09/19   Sheila Oats, MD  simethicone (MYLICON) 80 MG chewable tablet Chew 1 tablet (80 mg total) by mouth as needed for flatulence. 10/08/19   Sheila Oats, MD  medroxyPROGESTERone (DEPO-PROVERA) 150 MG/ML injection Inject 1 mL (150 mg total) into the muscle every 3 (three) months. 02/10/18 11/05/18  Adam Phenix, MD    Family History Family History  Problem Relation Age of Onset   Healthy Mother    Healthy Father    Cancer Maternal Grandmother  Social History Social History   Tobacco Use   Smoking status: Former    Types: Cigarettes    Quit date: 01/24/2019    Years since quitting: 1.7   Smokeless tobacco: Never  Vaping Use   Vaping Use: Never used  Substance Use Topics   Alcohol use: Not Currently   Drug use: Never     Allergies   Peanuts [peanut oil]   Review of Systems Review of Systems Per HPI  Physical Exam Triage Vital Signs ED Triage Vitals  Enc Vitals Group     BP 10/30/20 1315 109/64     Pulse Rate 10/30/20 1315 64     Resp 10/30/20 1315 16     Temp 10/30/20 1315 98.1 F (36.7 C)     Temp Source 10/30/20 1315 Oral     SpO2 10/30/20 1315 100 %     Weight --      Height --      Head Circumference --      Peak Flow --      Pain Score 10/30/20 1314 5     Pain Loc --      Pain Edu? --      Excl. in GC? --    No data  found.  Updated Vital Signs BP 109/64 (BP Location: Right Arm)   Pulse 64   Temp 98.1 F (36.7 C) (Oral)   Resp 16   LMP 10/13/2020 (Exact Date)   SpO2 100%   Visual Acuity Right Eye Distance:   Left Eye Distance:   Bilateral Distance:    Right Eye Near:   Left Eye Near:    Bilateral Near:     Physical Exam Vitals and nursing note reviewed.  Constitutional:      Appearance: Normal appearance. She is not ill-appearing.  HENT:     Head: Atraumatic.     Mouth/Throat:     Mouth: Mucous membranes are moist.  Eyes:     Extraocular Movements: Extraocular movements intact.     Conjunctiva/sclera: Conjunctivae normal.  Cardiovascular:     Rate and Rhythm: Normal rate and regular rhythm.     Heart sounds: Normal heart sounds.  Pulmonary:     Effort: Pulmonary effort is normal.     Breath sounds: Normal breath sounds.  Abdominal:     General: Bowel sounds are normal. There is no distension.     Palpations: Abdomen is soft.     Tenderness: There is no abdominal tenderness. There is no right CVA tenderness, left CVA tenderness, guarding or rebound.  Genitourinary:    Comments: GU exam deferred, self swab performed Musculoskeletal:        General: Normal range of motion.     Cervical back: Normal range of motion and neck supple.  Skin:    General: Skin is warm and dry.  Neurological:     Mental Status: She is alert and oriented to person, place, and time.  Psychiatric:        Mood and Affect: Mood normal.        Thought Content: Thought content normal.        Judgment: Judgment normal.   UC Treatments / Results  Labs (all labs ordered are listed, but only abnormal results are displayed) Labs Reviewed  POCT URINALYSIS DIPSTICK, ED / UC - Abnormal; Notable for the following components:      Result Value   Leukocytes,Ua SMALL (*)    All other components within normal limits  URINE CULTURE  HIV ANTIBODY (ROUTINE TESTING W REFLEX)  RPR  POC URINE PREG, ED   CERVICOVAGINAL ANCILLARY ONLY    EKG   Radiology No results found.  Procedures Procedures (including critical care time)  Medications Ordered in UC Medications - No data to display  Initial Impression / Assessment and Plan / UC Course  I have reviewed the triage vital signs and the nursing notes.  Pertinent labs & imaging results that were available during my care of the patient were reviewed by me and considered in my medical decision making (see chart for details).     Exam and vitals benign and reassuring, UA with small leukocytes.  Urine culture pending, vaginal swab, HIV and syphilis labs also pending.  Suspect vaginal infection causing symptoms today.  Will await remainder of results and adjust course as needed.  Discussed abstinence while awaiting test results, safe sexual practices, good vaginal hygiene and supportive care in the meantime.  Return for acutely worsening symptoms.  Final Clinical Impressions(s) / UC Diagnoses   Final diagnoses:  Urinary frequency  Concern about STD in female without diagnosis   Discharge Instructions   None    ED Prescriptions   None    PDMP not reviewed this encounter.   Particia Nearing, [place] 10/30/20 1400

## 2020-10-30 NOTE — ED Triage Notes (Signed)
Pt in with c/o back pain, urinary frequency and pain when urinating x 1 week  States she has been drinking cranberry juice to help with sx

## 2020-10-31 ENCOUNTER — Telehealth (HOSPITAL_COMMUNITY): Payer: Self-pay | Admitting: Emergency Medicine

## 2020-10-31 LAB — CERVICOVAGINAL ANCILLARY ONLY
Bacterial Vaginitis (gardnerella): POSITIVE — AB
Candida Glabrata: NEGATIVE
Candida Vaginitis: NEGATIVE
Chlamydia: NEGATIVE
Comment: NEGATIVE
Comment: NEGATIVE
Comment: NEGATIVE
Comment: NEGATIVE
Comment: NEGATIVE
Comment: NORMAL
Neisseria Gonorrhea: NEGATIVE
Trichomonas: NEGATIVE

## 2020-10-31 LAB — URINE CULTURE: Culture: NO GROWTH

## 2020-10-31 LAB — RPR: RPR Ser Ql: NONREACTIVE

## 2020-10-31 MED ORDER — METRONIDAZOLE 500 MG PO TABS: 500.0000 mg | ORAL_TABLET | Freq: Two times a day (BID) | ORAL | 0 refills | Status: DC

## 2020-12-02 ENCOUNTER — Encounter (HOSPITAL_COMMUNITY): Payer: Self-pay | Admitting: Emergency Medicine

## 2020-12-02 ENCOUNTER — Other Ambulatory Visit: Payer: Self-pay

## 2020-12-02 ENCOUNTER — Ambulatory Visit (HOSPITAL_COMMUNITY)
Admission: EM | Admit: 2020-12-02 | Discharge: 2020-12-02 | Disposition: A | Discharge status: Home / Self Care | Payer: Medicaid Other | Attending: Family Medicine | Admitting: Family Medicine

## 2020-12-02 DIAGNOSIS — N898 Other specified noninflammatory disorders of vagina: Secondary | ICD-10-CM | POA: Insufficient documentation

## 2020-12-02 MED ORDER — METRONIDAZOLE 500 MG PO TABS
500.0000 mg | ORAL_TABLET | Freq: Two times a day (BID) | ORAL | 0 refills | Status: DC
Start: 2020-12-02 — End: 2020-12-09

## 2020-12-02 NOTE — ED Triage Notes (Signed)
C/o vaginal discharge with no odor for a couple days. Believes it is BV again

## 2020-12-02 NOTE — Discharge Instructions (Signed)
We have sent testing for sexually transmitted infections. We will notify you of any positive results once they are received. If required, we will prescribe any medications you might need.  Please refrain from all sexual activity for at least the next seven days.  

## 2020-12-04 LAB — CERVICOVAGINAL ANCILLARY ONLY
Bacterial Vaginitis (gardnerella): POSITIVE — AB
Candida Glabrata: NEGATIVE
Candida Vaginitis: NEGATIVE
Chlamydia: NEGATIVE
Comment: NEGATIVE
Comment: NEGATIVE
Comment: NEGATIVE
Comment: NEGATIVE
Comment: NEGATIVE
Comment: NORMAL
Neisseria Gonorrhea: NEGATIVE
Trichomonas: NEGATIVE

## 2020-12-04 NOTE — ED Provider Notes (Signed)
  [facility]   [id number] 12/02/20 Arrival Time: 1629  ASSESSMENT & PLAN:  1. Vaginal discharge    Meds ordered this encounter  Medications   metroNIDAZOLE (FLAGYL) 500 MG tablet    Sig: Take 1 tablet (500 mg total) by mouth 2 (two) times daily.    Dispense:  14 tablet    Refill:  0   No other empiric tx desired.    Discharge Instructions      We have sent testing for sexually transmitted infections. We will notify you of any positive results once they are received. If required, we will prescribe any medications you might need.  Please refrain from all sexual activity for at least the next seven days.     Without s/s of PID.  Pending: Labs Reviewed  CERVICOVAGINAL ANCILLARY ONLY   Will notify of any positive results. Instructed to refrain from sexual activity for at least seven days.  Reviewed expectations re: course of current medical issues. Questions answered. Outlined signs and symptoms indicating need for more acute intervention. Patient verbalized understanding. After Visit Summary given.   SUBJECTIVE:  Erika Bailey is a 20 y.o. female who presents with complaint of vaginal discharge. Onset gradual. First noticed  2 days . Denies: urinary frequency, dysuria, and gross hematuria. Afebrile. No abdominal or pelvic pain. Normal PO intake wihout n/v. No genital rashes or lesions. Reports that she is sexually active.  Patient's last menstrual period was 11/04/2020 (approximate).   OBJECTIVE:  Vitals:   12/02/20 1644  BP: 112/76  Pulse: 70  Resp: 16  Temp: 99.1 F (37.3 C)  TempSrc: Oral  SpO2: 99%     General appearance: alert, cooperative, appears stated age and no distress Lungs: unlabored respirations; speaks full sentences without difficulty Back: no CVA tenderness; FROM at waist Abdomen: soft, non-tender GU: deferred Skin: warm and dry Psychological: alert and cooperative; normal mood and affect.    Labs Reviewed   CERVICOVAGINAL ANCILLARY ONLY    Allergies  Allergen Reactions   Peanuts [Peanut Oil] Itching and Swelling    History reviewed. No pertinent past medical history. Family History  Problem Relation Age of Onset   Healthy Mother    Healthy Father    Cancer Maternal Grandmother    Social History   Socioeconomic History   Marital status: Single    Spouse name: Not on file   Number of children: Not on file   Years of education: Not on file   Highest education level: Not on file  Occupational History   Occupation: [employer]  Tobacco Use   Smoking status: Former    Types: Cigarettes    Quit date: 01/24/2019    Years since quitting: 1.8   Smokeless tobacco: Never  Vaping Use   Vaping Use: Never used  Substance and Sexual Activity   Alcohol use: Not Currently   Drug use: Never   Sexual activity: Yes    Birth control/protection: None  Other Topics Concern   Not on file  Social History Narrative   Not on file   Social Determinants of Health   Financial Resource Strain: Not on file  Food Insecurity: Not on file  Transportation Needs: Not on file  Physical Activity: Not on file  Stress: Not on file  Social Connections: Not on file  Intimate Partner Violence: Not on file           [place], MD 12/04/20 [id number]

## 2020-12-09 ENCOUNTER — Encounter (HOSPITAL_COMMUNITY): Payer: Self-pay | Admitting: Obstetrics and Gynecology

## 2020-12-09 ENCOUNTER — Inpatient Hospital Stay (HOSPITAL_COMMUNITY): Payer: Medicaid Other

## 2020-12-09 ENCOUNTER — Other Ambulatory Visit: Payer: Self-pay

## 2020-12-09 ENCOUNTER — Inpatient Hospital Stay (HOSPITAL_COMMUNITY)
Admission: AD | Admit: 2020-12-09 | Discharge: 2020-12-09 | Disposition: A | Discharge status: Home / Self Care | Payer: Medicaid Other | Attending: Obstetrics and Gynecology | Admitting: Obstetrics and Gynecology

## 2020-12-09 DIAGNOSIS — O26891 Other specified pregnancy related conditions, first trimester: Secondary | ICD-10-CM | POA: Insufficient documentation

## 2020-12-09 DIAGNOSIS — Z3A01 Less than 8 weeks gestation of pregnancy: Secondary | ICD-10-CM | POA: Diagnosis not present

## 2020-12-09 DIAGNOSIS — Z3201 Encounter for pregnancy test, result positive: Secondary | ICD-10-CM | POA: Insufficient documentation

## 2020-12-09 DIAGNOSIS — R109 Unspecified abdominal pain: Secondary | ICD-10-CM | POA: Insufficient documentation

## 2020-12-09 DIAGNOSIS — O3680X0 Pregnancy with inconclusive fetal viability, not applicable or unspecified: Secondary | ICD-10-CM | POA: Diagnosis not present

## 2020-12-09 DIAGNOSIS — N8312 Corpus luteum cyst of left ovary: Secondary | ICD-10-CM | POA: Diagnosis not present

## 2020-12-09 LAB — WET PREP, GENITAL
Clue Cells Wet Prep HPF POC: NONE SEEN
Sperm: NONE SEEN
Trich, Wet Prep: NONE SEEN
WBC, Wet Prep HPF POC: >=10 — AB
Yeast Wet Prep HPF POC: NONE SEEN

## 2020-12-09 LAB — CBC
HCT: 39.2 % (ref 36.0–46.0)
Hemoglobin: 12.8 g/dL (ref 12.0–15.0)
MCH: 29 pg (ref 26.0–34.0)
MCHC: 32.7 g/dL (ref 30.0–36.0)
MCV: 88.9 fL (ref 80.0–100.0)
Platelets: 339 10*3/uL (ref 150–400)
RBC: 4.41 MIL/uL (ref 3.87–5.11)
RDW: 13.2 % (ref 11.5–15.5)
WBC: 6.2 10*3/uL (ref 4.0–10.5)
nRBC: 0 % (ref 0.0–0.2)

## 2020-12-09 LAB — URINALYSIS, ROUTINE W REFLEX MICROSCOPIC
Bilirubin Urine: NEGATIVE
Glucose, UA: NEGATIVE mg/dL
Hgb urine dipstick: NEGATIVE
Ketones, ur: 5 mg/dL — AB
Nitrite: NEGATIVE
Protein, ur: NEGATIVE mg/dL
Specific Gravity, Urine: 1.027 (ref 1.005–1.030)
pH: 5 (ref 5.0–8.0)

## 2020-12-09 LAB — HCG, QUANTITATIVE, PREGNANCY: hCG, Beta Chain, Quant, S: 1857 m[IU]/mL — ABNORMAL HIGH (ref <5)

## 2020-12-09 NOTE — MAU Note (Signed)
Pt reports to mau with c/o lower abd pain that started today after a pos HPT.  Pt unsure of LMP and would like to know her GA.  Denies vag bleeding.

## 2020-12-09 NOTE — MAU Provider Note (Signed)
History     CSN: [id number]  Arrival date and time: 12/09/20 1714   None     Chief Complaint  Patient presents with   Abdominal Pain   Possible Pregnancy   HPI  Ms.Erika Bailey is a 20 y.o. female G3P1011 @ [redacted]w[redacted]d here in MAU with complaints of positive pregnancy test at home with abdominal pain. The pain started 2 weeks ago. The pain felt like pain she has when she Is going to start her period. She has not bleeding. She currently rates her pain 4/10. She has not taken any medication for the pain.   OB History     Gravida  3   Para  1   Term  1   Preterm      AB  1   Living  1      SAB  1   IAB      Ectopic      Multiple  0   Live Births  1           No past medical history on file.  Past Surgical History:  Procedure Laterality Date   DILATION AND EVACUATION N/A 01/21/2018   Procedure: DILATATION AND EVACUATION;  Surgeon: Adam Phenix, MD;  Location: [facility];  Service: Gynecology;  Laterality: N/A;   NO PAST SURGERIES      Family History  Problem Relation Age of Onset   Healthy Mother    Healthy Father    Cancer Maternal Grandmother     Social History   Tobacco Use   Smoking status: Former    Types: Cigarettes    Quit date: 01/24/2019    Years since quitting: 1.8   Smokeless tobacco: Never  Vaping Use   Vaping Use: Never used  Substance Use Topics   Alcohol use: Not Currently   Drug use: Never    Allergies:  Allergies  Allergen Reactions   Peanuts [Peanut Oil] Itching and Swelling    Medications Prior to Admission  Medication Sig Dispense Refill Last Dose   acetaminophen (TYLENOL) 325 MG tablet Take 2 tablets (650 mg total) by mouth every 6 (six) hours.      coconut oil OIL Apply 1 application topically as needed.  0    diphenhydrAMINE (BENADRYL) 25 mg capsule Take 1 capsule (25 mg total) by mouth every 6 (six) hours as needed for itching. 30 capsule 0    Elastic Bandages & Supports (COMFORT FIT MATERNITY SUPP SM) MISC  Wear as directed. (Patient not taking: No sig reported) 1 each 0    ibuprofen (ADVIL) 600 MG tablet Take 1 tablet (600 mg total) by mouth every 8 (eight) hours as needed for mild pain. 30 tablet 0    metroNIDAZOLE (FLAGYL) 500 MG tablet Take 1 tablet (500 mg total) by mouth 2 (two) times daily. 14 tablet 0    norethindrone (ORTHO MICRONOR) 0.35 MG tablet Take 1 tablet (0.35 mg total) by mouth daily. 28 tablet 2    Prenatal 27-1 MG TABS TAKE 1 TABLET BY MOUTH DAILY 30 tablet 13    senna-docusate (SENOKOT-S) 8.6-50 MG tablet Take 2 tablets by mouth daily.      simethicone (MYLICON) 80 MG chewable tablet Chew 1 tablet (80 mg total) by mouth as needed for flatulence. 30 tablet 0    Results for orders placed or performed during the hospital encounter of 12/09/20 (from the past 48 hour(s))  Urinalysis, Routine w reflex microscopic Urine, Clean Catch  Status: Abnormal   Collection Time: 12/09/20  6:21 PM  Result Value Ref Range   Color, Urine YELLOW YELLOW   APPearance HAZY (A) CLEAR   Specific Gravity, Urine 1.027 1.005 - 1.030   pH 5.0 5.0 - 8.0   Glucose, UA NEGATIVE NEGATIVE mg/dL   Hgb urine dipstick NEGATIVE NEGATIVE   Bilirubin Urine NEGATIVE NEGATIVE   Ketones, ur 5 (A) NEGATIVE mg/dL   Protein, ur NEGATIVE NEGATIVE mg/dL   Nitrite NEGATIVE NEGATIVE   Leukocytes,Ua MODERATE (A) NEGATIVE   RBC / HPF 0-5 0 - 5 RBC/hpf   WBC, UA 6-10 0 - 5 WBC/hpf   Bacteria, UA RARE (A) NONE SEEN   Squamous Epithelial / LPF 11-20 0 - 5   Mucus PRESENT     Comment: Performed at [facility], [address] [place], [place] [address]  Culture, [place] Urine     Status: Abnormal   Collection Time: 12/09/20  6:21 PM   Specimen: Urine, Random  Result Value Ref Range   Specimen Description URINE, RANDOM    Special Requests NONE    Culture (A)     MULTIPLE SPECIES PRESENT, SUGGEST RECOLLECTION NO GROUP B STREP (S.AGALACTIAE) ISOLATED Performed at [facility], [address],  [place], [place] [address]    Report Status 12/11/2020 FINAL   CBC     Status: None   Collection Time: 12/09/20  7:14 PM  Result Value Ref Range   WBC 6.2 4.0 - 10.5 K/uL   RBC 4.41 3.87 - 5.11 MIL/uL   Hemoglobin 12.8 12.0 - 15.0 g/dL   HCT 67.6 72.0 - 94.7 %   MCV 88.9 80.0 - 100.0 fL   MCH 29.0 26.0 - 34.0 pg   MCHC 32.7 30.0 - 36.0 g/dL   RDW 09.6 28.3 - 66.2 %   Platelets 339 150 - 400 K/uL   nRBC 0.0 0.0 - 0.2 %    Comment: Performed at [facility], [address] [place], [place] [address]  hCG, quantitative, pregnancy     Status: Abnormal   Collection Time: 12/09/20  7:14 PM  Result Value Ref Range   hCG, Beta Chain, Quant, S 1,857 (H) <5 mIU/mL    Comment:          GEST. AGE      CONC.  (mIU/mL)   <=1 WEEK        5 - 50     2 WEEKS       50 - 500     3 WEEKS       100 - 10,000     4 WEEKS     1,000 - 30,000     5 WEEKS     3,500 - 115,000   6-8 WEEKS     12,000 - 270,000    12 WEEKS     15,000 - 220,000        FEMALE AND NON-PREGNANT FEMALE:     LESS THAN 5 mIU/mL Performed at [facility], [address] [place], [place] [address]   Wet prep, genital     Status: Abnormal   Collection Time: 12/09/20  8:33 PM   Specimen: PATH Cytology Cervicovaginal Ancillary Only  Result Value Ref Range   Yeast Wet Prep HPF POC NONE SEEN NONE SEEN   Trich, Wet Prep NONE SEEN NONE SEEN   Clue Cells Wet Prep HPF POC NONE SEEN NONE SEEN   WBC, Wet Prep HPF  POC >=10 (A) NONE SEEN   Sperm NONE SEEN     Comment: Performed at [facility], [address], [place], [place] [address]    US OB LESS THAN 14 WEEKS WITH OB TRANSVAGINAL  Result Date: 12/09/2020 CLINICAL DATA:  Initial evaluation for acute abdominal pain, early pregnancy. EXAM: OBSTETRIC <14 WK [place] AND TRANSVAGINAL OB US TECHNIQUE: Both transabdominal and transvaginal ultrasound examinations were performed for complete evaluation of the gestation as well as the maternal uterus, adnexal regions, and pelvic  cul-de-sac. Transvaginal technique was performed to assess early pregnancy. COMPARISON:  None available. FINDINGS: Intrauterine gestational sac: Single Yolk sac:  Negative. Embryo:  Negative. Cardiac Activity: Negative. Heart Rate: N/A MSD: 2.3 mm   4 w   6 d Subchorionic hemorrhage:  None visualized. Maternal uterus/adnexae: Ovaries within normal limits bilaterally. 1.9 cm degenerating corpus luteal cyst noted within the left ovary. No adnexal mass or free fluid. Additional note made of small volume minimally complex free fluid within the endometrial cavity. Maternal uterus otherwise unremarkable. IMPRESSION: 1. Probable early intrauterine gestational sac, but no yolk sac, fetal pole, or cardiac activity yet visualized. Estimated gestational age [redacted] weeks and 6 days by MSD. Recommend follow-up quantitative B-HCG levels and follow-up US in 14 days to confirm and assess viability. This recommendation follows SRU consensus guidelines: Diagnostic Criteria for Nonviable Pregnancy Early in the First Trimester. Malva Limes Med 2013; 224:4975-30. 2. Additional small volume minimally complex free fluid within the endometrial cavity. 3. 1.9 cm degenerating left ovarian corpus luteal cyst. 4. No other acute maternal uterine or adnexal abnormality. Electronically Signed   By: Rise Mu M.D.   On: 12/09/2020 20:42     Review of Systems  Constitutional:  Negative for fever.  Gastrointestinal:  Positive for abdominal pain.  Genitourinary:  Negative for vaginal bleeding.  Physical Exam   Blood pressure 121/69, pulse 85, temperature 98.7 F (37.1 C), temperature source Oral, resp. rate 15, last menstrual period 11/07/2020, SpO2 100 %, not currently breastfeeding.  Physical Exam Vitals and nursing note reviewed.  Constitutional:      General: She is not in acute distress.    Appearance: She is well-developed. She is not ill-appearing, toxic-appearing or diaphoretic.  HENT:     Head: Normocephalic.  Pulmonary:      Effort: Pulmonary effort is normal.  Abdominal:     Tenderness: There is generalized abdominal tenderness. There is no guarding or rebound.  Neurological:     Mental Status: She is alert and oriented to person, place, and time.    MAU Course  Procedures None  MDM  A positive blood type.  Wet prep & GC HIV, CBC, Hcg, ABO US OB transvaginal    Assessment and Plan   A:  Pregnancy of unknown anatomic location - Plan: Discharge patient  Abdominal pain during pregnancy in first trimester - Plan: US OB LESS THAN 14 WEEKS WITH OB TRANSVAGINAL, US OB LESS THAN 14 WEEKS WITH OB TRANSVAGINAL, Discharge patient  [redacted] weeks gestation of pregnancy - Plan: Discharge patient    P:  Discharge home in stable condition Ectopic precautions.  Go to [facility] on 10/4 for repeat stat Hcg level Return to MAU if symptoms worsen  Pelvic rest.   Erika Bailey, Erika Rutherford, NP 12/11/2020 8:10 AM

## 2020-12-11 LAB — CULTURE, OB URINE

## 2020-12-11 LAB — GC/CHLAMYDIA PROBE AMP (~~LOC~~) NOT AT ARMC
Chlamydia: NEGATIVE
Comment: NEGATIVE
Comment: NORMAL
Neisseria Gonorrhea: NEGATIVE

## 2020-12-11 LAB — POCT PREGNANCY, URINE: Preg Test, Ur: POSITIVE — AB

## 2020-12-12 ENCOUNTER — Ambulatory Visit (INDEPENDENT_AMBULATORY_CARE_PROVIDER_SITE_OTHER): Payer: Medicaid Other

## 2020-12-12 ENCOUNTER — Other Ambulatory Visit: Payer: Self-pay

## 2020-12-12 DIAGNOSIS — R109 Unspecified abdominal pain: Secondary | ICD-10-CM

## 2020-12-12 DIAGNOSIS — O26891 Other specified pregnancy related conditions, first trimester: Secondary | ICD-10-CM

## 2020-12-12 DIAGNOSIS — O200 Threatened abortion: Secondary | ICD-10-CM | POA: Diagnosis not present

## 2020-12-12 LAB — BETA HCG QUANT (REF LAB): hCG Quant: 3523 m[IU]/mL

## 2020-12-12 NOTE — Progress Notes (Signed)
Pt is in the office for follow up STAT hcg after MAU visit on 12/09/20. Pt denies any bleeding or pain in office today. Previous results were 1,857 on 12/09/20

## 2020-12-12 NOTE — Progress Notes (Signed)
Patient was assessed and managed by nursing staff during this encounter. I have reviewed the chart and agree with the documentation and plan. I have also made any necessary editorial changes.  Kamiryn Bezanson A Lucina Betty, MD 12/12/2020 3:29 PM   

## 2020-12-12 NOTE — Progress Notes (Signed)
Contacted pt and advised that provider reviewed HCG results and there is an appropriate rise. Advised that schedulers will contact for next appt, pt agreed.

## 2021-06-03 ENCOUNTER — Ambulatory Visit (HOSPITAL_COMMUNITY)
Admission: EM | Admit: 2021-06-03 | Discharge: 2021-06-03 | Disposition: A | Discharge status: Home / Self Care | Payer: Medicaid Other

## 2021-06-03 ENCOUNTER — Encounter (HOSPITAL_COMMUNITY): Payer: Self-pay

## 2021-06-03 ENCOUNTER — Other Ambulatory Visit: Payer: Self-pay

## 2021-06-03 DIAGNOSIS — N750 Cyst of Bartholin's gland: Secondary | ICD-10-CM

## 2021-06-03 NOTE — Discharge Instructions (Signed)
Please go to the ER as soon as you leave urgent care for further evaluation and management.  

## 2021-06-03 NOTE — ED Triage Notes (Signed)
Pt presents with c/o a cyst inside of her vagina. Pt states she wants to give a urine sample.  ?

## 2021-06-03 NOTE — ED Provider Notes (Signed)
?[name] ? ? ? ?CSN: [id number] ?Arrival date & time: 06/03/21  1634 ? ? ?  ? ?History   ?Chief Complaint ?Chief Complaint  ?Patient presents with  ? Cyst  ? ? ?HPI ?Erika Bailey is a 21 y.o. female.  ? ?Patient presents with concern for cyst on the right labia that has been present for approximately 2 weeks.  Patient reports intermittent pain to the area.  Denies any drainage from the area.  Denies fevers, body aches, chills.  Patient also requesting routine STD testing with a vaginal swab.  Denies any known exposure or any current symptoms.  Patient reports that she wants STD testing just to "make sure everything is okay" because of the cyst. ? ? ? ?History reviewed. No pertinent past medical history. ? ?Patient Active Problem List  ? Diagnosis Date Noted  ? Encounter for induction of labor 10/06/2019  ? Group B Streptococcus carrier, +RV culture, currently pregnant 09/30/2019  ? 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 Supervision of other normal pregnancy, antepartum 02/10/2019  ? ? ?Past Surgical History:  ?Procedure Laterality Date  ? DILATION AND EVACUATION N/A 01/21/2018  ? Procedure: DILATATION AND EVACUATION;  Surgeon: Woodroe Mode, MD;  Location: [facility];  Service: Gynecology;  Laterality: N/A;  ? NO PAST SURGERIES    ? ? ?OB History   ? ? Gravida  ?3  ? Para  ?1  ? Term  ?1  ? Preterm  ?   ? AB  ?1  ? Living  ?1  ?  ? ? SAB  ?1  ? IAB  ?   ? Ectopic  ?   ? Multiple  ?0  ? Live Births  ?1  ?   ?  ?  ? ? ? ?Home Medications   ? ?Prior to Admission medications   ?Medication Sig Start Date End Date Taking? Authorizing Provider  ?acetaminophen (TYLENOL) 325 MG tablet Take 2 tablets (650 mg total) by mouth every 6 (six) hours. ?Patient not taking: Reported on 12/12/2020 10/08/19   Randa Ngo, MD  ?metroNIDAZOLE (FLAGYL) 500 MG tablet Take 500 mg by mouth 2 (two) times daily.    [provider]  ?Prenatal 27-1 MG TABS TAKE 1 TABLET BY MOUTH DAILY ?Patient not taking:  Reported on 12/12/2020 04/19/20   Shelly Bombard, MD  ?senna-docusate (SENOKOT-S) 8.6-50 MG tablet Take 2 tablets by mouth daily. ?Patient not taking: Reported on 12/12/2020 10/09/19   Randa Ngo, MD  ?simethicone (MYLICON) 80 MG chewable tablet Chew 1 tablet (80 mg total) by mouth as needed for flatulence. ?Patient not taking: Reported on 12/12/2020 10/08/19   Randa Ngo, MD  ?medroxyPROGESTERone (DEPO-PROVERA) 150 MG/ML injection Inject 1 mL (150 mg total) into the muscle every 3 (three) months. 02/10/18 11/05/18  Woodroe Mode, MD  ? ? ?Family History ?Family History  ?Problem Relation Age of Onset  ? Healthy Mother   ? Healthy Father   ? Cancer Maternal Grandmother   ? ? ?Social History ?Social History  ? ?Tobacco Use  ? Smoking status: Former  ?  Types: Cigarettes  ?  Quit date: 01/24/2019  ?  Years since quitting: 2.3  ? Smokeless tobacco: Never  ?Vaping Use  ? Vaping Use: Never used  ?Substance Use Topics  ? Alcohol use: Yes  ?  Alcohol/week: 1.0 standard drink  ?  Types: 1 Glasses of wine per week  ? Drug use: Never  ? ? ? ?  Allergies   ?Peanuts [peanut oil] ? ? ?Review of Systems ?Review of Systems ?Per HPI ? ?Physical Exam ?Triage Vital Signs ?ED Triage Vitals  ?Enc Vitals Group  ?   BP 06/03/21 1730 109/69  ?   Pulse Rate 06/03/21 1730 82  ?   Resp 06/03/21 1730 19  ?   Temp --   ?   Temp Source 06/03/21 1730 Oral  ?   SpO2 06/03/21 1730 100 %  ?   Weight --   ?   Height --   ?   Head Circumference --   ?   Peak Flow --   ?   Pain Score 06/03/21 1729 0  ?   Pain Loc --   ?   Pain Edu? --   ?   Excl. in [place]? --   ? ?No data found. ? ?Updated Vital Signs ?BP 109/69 (BP Location: Right Arm)   Pulse 82   Temp 98.2 ?F (36.8 ?C) (Oral)   Resp 19   LMP 04/11/2021 (Exact Date)   SpO2 100%   Breastfeeding Unknown  ? ?Visual Acuity ?Right Eye Distance:   ?Left Eye Distance:   ?Bilateral Distance:   ? ?Right Eye Near:   ?Left Eye Near:    ?Bilateral Near:    ? ?Physical Exam ?Exam conducted with a  chaperone present.  ?Constitutional:   ?   General: She is not in acute distress. ?   Appearance: Normal appearance. She is not toxic-appearing or diaphoretic.  ?HENT:  ?   Head: Normocephalic and atraumatic.  ?Eyes:  ?   Extraocular Movements: Extraocular movements intact.  ?   Conjunctiva/sclera: Conjunctivae normal.  ?Pulmonary:  ?   Effort: Pulmonary effort is normal.  ?Genitourinary: ?   Comments: Bartholin cyst/abscess present to right labia.  No drainage noted at this time. ?Neurological:  ?   General: No focal deficit present.  ?   Mental Status: She is alert and oriented to person, place, and time. Mental status is at baseline.  ?Psychiatric:     ?   Mood and Affect: Mood normal.     ?   Behavior: Behavior normal.     ?   Thought Content: Thought content normal.     ?   Judgment: Judgment normal.  ? ? ? ?UC Treatments / Results  ?Labs ?(all labs ordered are listed, but only abnormal results are displayed) ?Labs Reviewed - No data to display ? ? ?EKG ? ? ?Radiology ?No results found. ? ?Procedures ?Procedures (including critical care time) ? ?Medications Ordered in UC ?Medications - No data to display ? ?Initial Impression / Assessment and Plan / UC Course  ?I have reviewed the triage vital signs and the nursing notes. ? ?Pertinent labs & imaging results that were available during my care of the patient were reviewed by me and considered in my medical decision making (see chart for details). ? ?  ? ?Patient was advised that she will need to go to the ER for further evaluation and management given Bartholin's cyst noted on exam.  Patient requesting STD testing but do not have urine test here, and advised patient of risk of doing vaginal swab given cyst that is present.  Will defer testing at this time to the ED if they are able to do other types of testing.  I think this is reasonable given that she simply wants routine testing. Patient was agreeable with this.  Patient was agreeable to going to the  ER as  well.  Vital signs stable at discharge.  Agree with patient self transport to the hospital ?Final Clinical Impressions(s) / UC Diagnoses  ? ?Final diagnoses:  ?Bartholin's cyst  ? ? ? ?Discharge Instructions   ? ?  ?Please go to the ER as soon as you leave urgent care for further evaluation and management. ? ? ? ?ED Prescriptions   ?None ?  ? ?PDMP not reviewed this encounter. ?  ?Teodora Medici, [place] ?06/03/21 1800 ? ?

## 2021-06-20 ENCOUNTER — Ambulatory Visit (HOSPITAL_COMMUNITY)
Admission: EM | Admit: 2021-06-20 | Discharge: 2021-06-20 | Disposition: A | Discharge status: Home / Self Care | Payer: Medicaid Other | Attending: Family Medicine | Admitting: Family Medicine

## 2021-06-20 ENCOUNTER — Encounter (HOSPITAL_COMMUNITY): Payer: Self-pay

## 2021-06-20 DIAGNOSIS — N760 Acute vaginitis: Secondary | ICD-10-CM | POA: Insufficient documentation

## 2021-06-20 LAB — POCT URINALYSIS DIPSTICK, ED / UC
Bilirubin Urine: NEGATIVE
Glucose, UA: NEGATIVE mg/dL
Hgb urine dipstick: NEGATIVE
Ketones, ur: NEGATIVE mg/dL
Leukocytes,Ua: NEGATIVE
Nitrite: NEGATIVE
Protein, ur: NEGATIVE mg/dL
Specific Gravity, Urine: 1.02 (ref 1.005–1.030)
Urobilinogen, UA: 0.2 mg/dL (ref 0.0–1.0)
pH: 8.5 — ABNORMAL HIGH (ref 5.0–8.0)

## 2021-06-20 LAB — POC URINE PREG, ED: Preg Test, Ur: NEGATIVE

## 2021-06-20 NOTE — ED Triage Notes (Addendum)
Onset today of vaginal odor and discharge with back pain and lower abdominal pain. No vaginal itching or irritation. Has used boric acid suppositories.  ?

## 2021-06-20 NOTE — ED Provider Notes (Signed)
?[facility] ? ? ? ?CSN: [id number] ?Arrival date & time: 06/20/21  1517 ? ? ?  ? ?History   ?Chief Complaint ?Chief Complaint  ?Patient presents with  ? Vaginal Discharge  ? ? ?HPI ?Erika Bailey is a 21 y.o. female.  ? ? ?Vaginal Discharge ?Here for a 2 to 3-day history of vaginal discharge and now some vaginal odor.  She also has a little lower abdominal pain.  Last menstrual period was April 3 ? ?She was seen here recently for a Bartholin's gland abscess.  She states that it resolved on its own after draining spontaneously ?History reviewed. No pertinent past medical history. ? ?Patient Active Problem List  ? Diagnosis Date Noted  ? Encounter for induction of labor 10/06/2019  ? Group B Streptococcus carrier, +RV culture, currently pregnant 09/30/2019  ? 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 Supervision of other normal pregnancy, antepartum 02/10/2019  ? ? ?Past Surgical History:  ?Procedure Laterality Date  ? DILATION AND EVACUATION N/A 01/21/2018  ? Procedure: DILATATION AND EVACUATION;  Surgeon: Adam Phenix, MD;  Location: [facility];  Service: Gynecology;  Laterality: N/A;  ? NO PAST SURGERIES    ? ? ?OB History   ? ? Gravida  ?3  ? Para  ?1  ? Term  ?1  ? Preterm  ?   ? AB  ?1  ? Living  ?1  ?  ? ? SAB  ?1  ? IAB  ?   ? Ectopic  ?   ? Multiple  ?0  ? Live Births  ?1  ?   ?  ?  ? ? ? ?Home Medications   ? ?Prior to Admission medications   ?Medication Sig Start Date End Date Taking? Authorizing Provider  ?acetaminophen (TYLENOL) 325 MG tablet Take 2 tablets (650 mg total) by mouth every 6 (six) hours. ?Patient not taking: Reported on 12/12/2020 10/08/19   Sheila Oats, MD  ?metroNIDAZOLE (FLAGYL) 500 MG tablet Take 500 mg by mouth 2 (two) times daily.    [provider]  ?Prenatal 27-1 MG TABS TAKE 1 TABLET BY MOUTH DAILY ?Patient not taking: Reported on 12/12/2020 04/19/20   Brock Bad, MD  ?senna-docusate (SENOKOT-S) 8.6-50 MG tablet Take 2 tablets by mouth  daily. ?Patient not taking: Reported on 12/12/2020 10/09/19   Sheila Oats, MD  ?simethicone (MYLICON) 80 MG chewable tablet Chew 1 tablet (80 mg total) by mouth as needed for flatulence. ?Patient not taking: Reported on 12/12/2020 10/08/19   Sheila Oats, MD  ?medroxyPROGESTERone (DEPO-PROVERA) 150 MG/ML injection Inject 1 mL (150 mg total) into the muscle every 3 (three) months. 02/10/18 11/05/18  Adam Phenix, MD  ? ? ?Family History ?Family History  ?Problem Relation Age of Onset  ? Healthy Mother   ? Healthy Father   ? Cancer Maternal Grandmother   ? ? ?Social History ?Social History  ? ?Tobacco Use  ? Smoking status: Former  ?  Types: Cigarettes  ?  Quit date: 01/24/2019  ?  Years since quitting: 2.4  ? Smokeless tobacco: Never  ?Vaping Use  ? Vaping Use: Never used  ?Substance Use Topics  ? Alcohol use: Yes  ?  Alcohol/week: 1.0 standard drink  ?  Types: 1 Glasses of wine per week  ? Drug use: Never  ? ? ? ?Allergies   ?Peanuts [peanut oil] ? ? ?Review of Systems ?Review of Systems  ?Genitourinary:  Positive for vaginal discharge.  ? ? ?  Physical Exam ?Triage Vital Signs ?ED Triage Vitals  ?Enc Vitals Group  ?   BP 06/20/21 1712 114/76  ?   Pulse Rate 06/20/21 1712 65  ?   Resp 06/20/21 1712 18  ?   Temp 06/20/21 1712 98.4 ?F (36.9 ?C)  ?   Temp Source 06/20/21 1712 Oral  ?   SpO2 06/20/21 1712 99 %  ?   Weight --   ?   Height --   ?   Head Circumference --   ?   Peak Flow --   ?   Pain Score 06/20/21 1711 5  ?   Pain Loc --   ?   Pain Edu? --   ?   Excl. in [place]? --   ? ?No data found. ? ?Updated Vital Signs ?BP 114/76 (BP Location: Left Arm)   Pulse 65   Temp 98.4 ?F (36.9 ?C) (Oral)   Resp 18   LMP 06/11/2021 (Exact Date)   SpO2 99%   Breastfeeding No  ? ?Visual Acuity ?Right Eye Distance:   ?Left Eye Distance:   ?Bilateral Distance:   ? ?Right Eye Near:   ?Left Eye Near:    ?Bilateral Near:    ? ?Physical Exam ?Vitals reviewed.  ?Constitutional:   ?   General: She is not in acute distress. ?    Appearance: She is not toxic-appearing.  ?HENT:  ?   Mouth/Throat:  ?   Mouth: Mucous membranes are moist.  ?Cardiovascular:  ?   Rate and Rhythm: Normal rate and regular rhythm.  ?Pulmonary:  ?   Effort: Pulmonary effort is normal.  ?   Breath sounds: Normal breath sounds.  ?Abdominal:  ?   Palpations: Abdomen is soft.  ?   Tenderness: There is no abdominal tenderness.  ?Skin: ?   Coloration: Skin is not jaundiced or pale.  ?Neurological:  ?   Mental Status: She is alert and oriented to person, place, and time.  ?Psychiatric:     ?   Behavior: Behavior normal.  ? ? ? ?UC Treatments / Results  ?Labs ?(all labs ordered are listed, but only abnormal results are displayed) ?Labs Reviewed  ?POCT URINALYSIS DIPSTICK, ED / UC - Abnormal; Notable for the following components:  ?    Result Value  ? pH 8.5 (*)   ? All other components within normal limits  ?POC URINE PREG, ED  ?CERVICOVAGINAL ANCILLARY ONLY  ? ? ?EKG ? ? ?Radiology ?No results found. ? ?Procedures ?Procedures (including critical care time) ? ?Medications Ordered in UC ?Medications - No data to display ? ?Initial Impression / Assessment and Plan / UC Course  ?I have reviewed the triage vital signs and the nursing notes. ? ?Pertinent labs & imaging results that were available during my care of the patient were reviewed by me and considered in my medical decision making (see chart for details). ? ?  ? ?UPT is negative. UA negative. Staff will call her and tx per protocol any positives on the swab ?Final Clinical Impressions(s) / UC Diagnoses  ? ?Final diagnoses:  ?None  ? ?Discharge Instructions   ?None ?  ? ?ED Prescriptions   ?None ?  ? ?PDMP not reviewed this encounter. ?  ?Zenia Resides, MD ?06/20/21 1759 ? ?

## 2021-06-20 NOTE — Discharge Instructions (Addendum)
The pregnancy test is negative. ?Urinalysis is negative. ? ?Staff will call you with any positive tests on the swab. ?

## 2021-06-21 ENCOUNTER — Telehealth (HOSPITAL_COMMUNITY): Payer: Self-pay | Admitting: Emergency Medicine

## 2021-06-21 LAB — CERVICOVAGINAL ANCILLARY ONLY
Bacterial Vaginitis (gardnerella): POSITIVE — AB
Candida Glabrata: NEGATIVE
Candida Vaginitis: NEGATIVE
Chlamydia: NEGATIVE
Comment: NEGATIVE
Comment: NEGATIVE
Comment: NEGATIVE
Comment: NEGATIVE
Comment: NEGATIVE
Comment: NORMAL
Neisseria Gonorrhea: NEGATIVE
Trichomonas: NEGATIVE

## 2021-06-21 MED ORDER — METRONIDAZOLE 500 MG PO TABS: 500.0000 mg | ORAL_TABLET | Freq: Two times a day (BID) | ORAL | 0 refills | Status: DC

## 2021-06-28 IMAGING — US US RENAL
1 series · 15 of 25 positions shown · non-contrast
Comparison: None.

CLINICAL DATA: Upper back pain.

EXAM:
RENAL / URINARY TRACT ULTRASOUND COMPLETE

[Series 1: us renal · 15 of 39 slices shown]
[im 1/39]
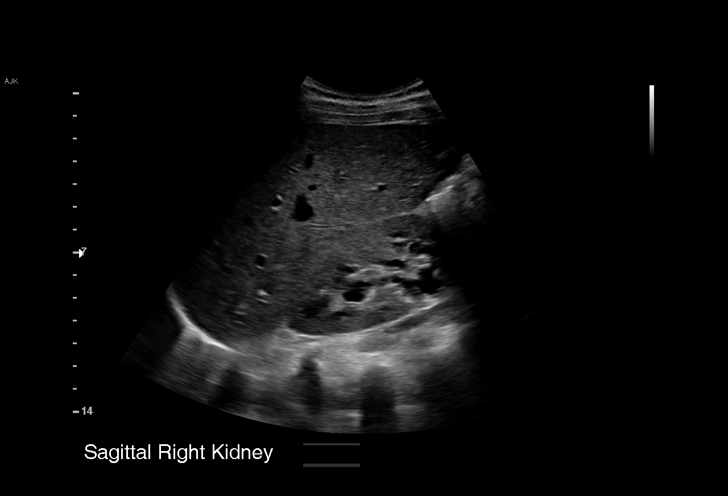
[im 4/39]
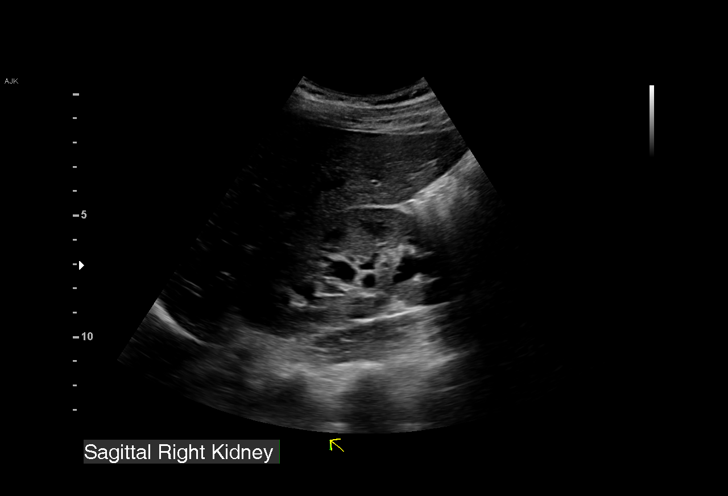
[im 7/39]
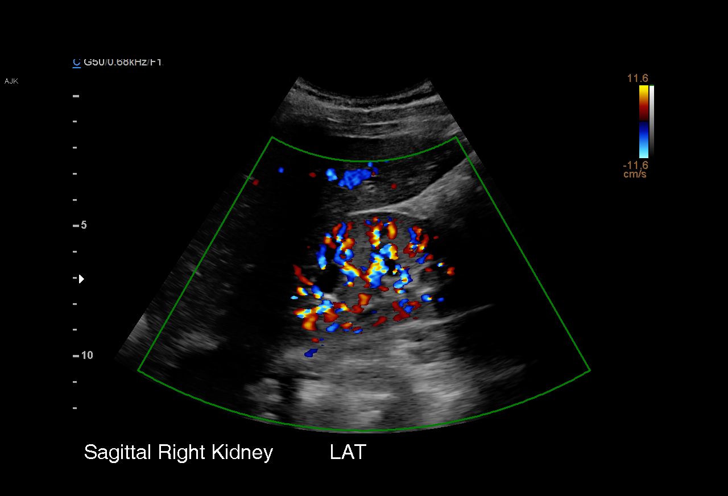
[im 8/39]
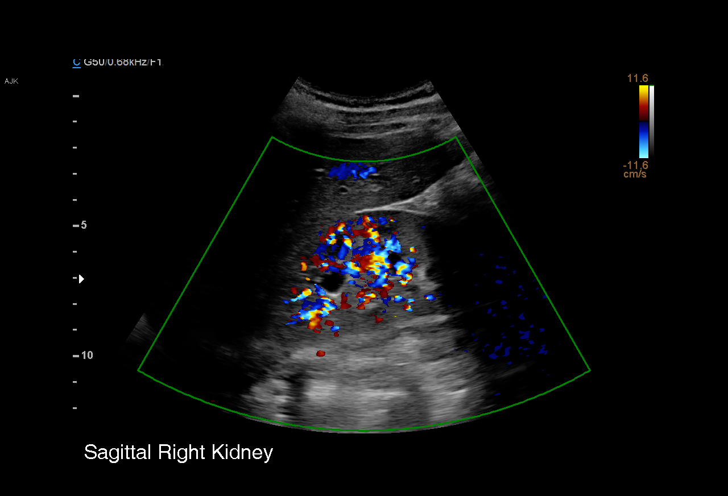
[im 12/39]
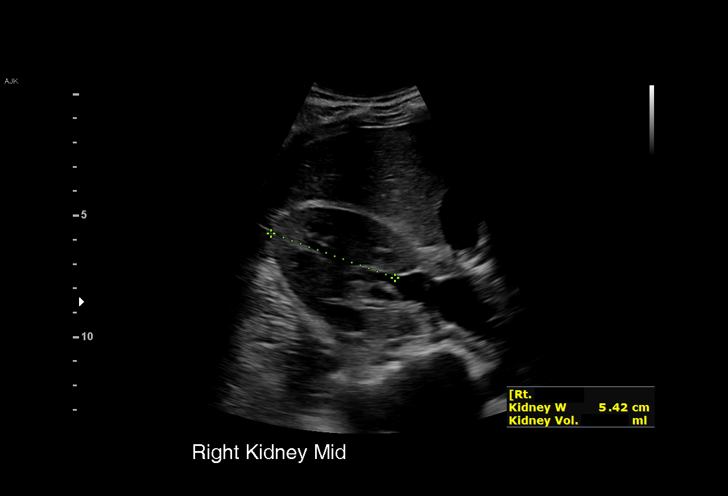
[im 15/39]
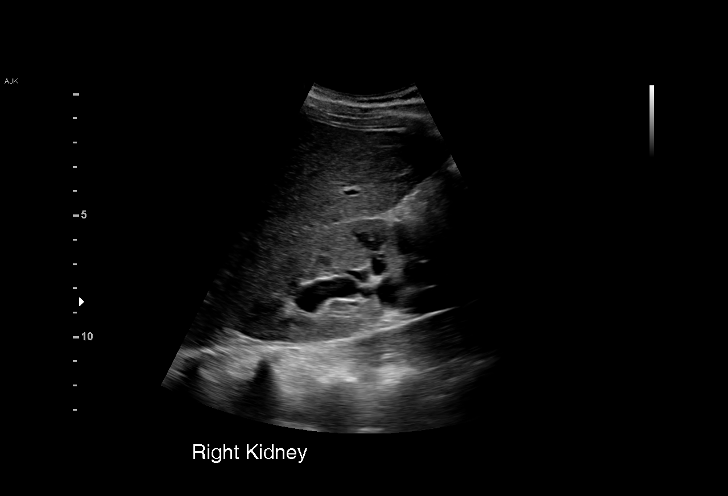
[im 16/39]
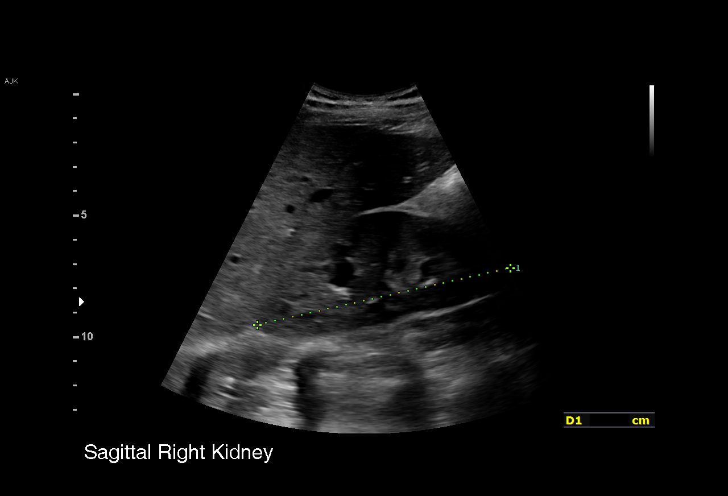
[im 20/39]
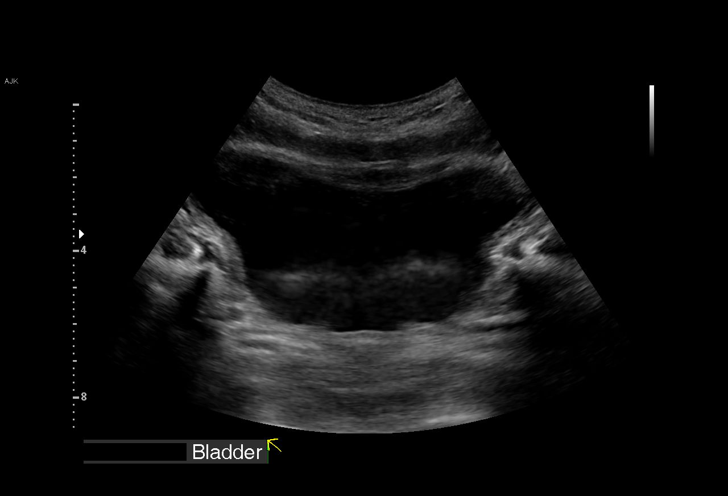
[im 23/39]
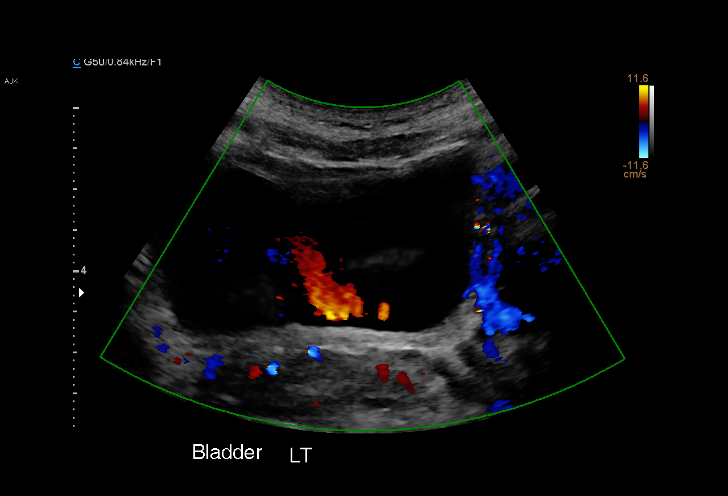
[im 24/39]
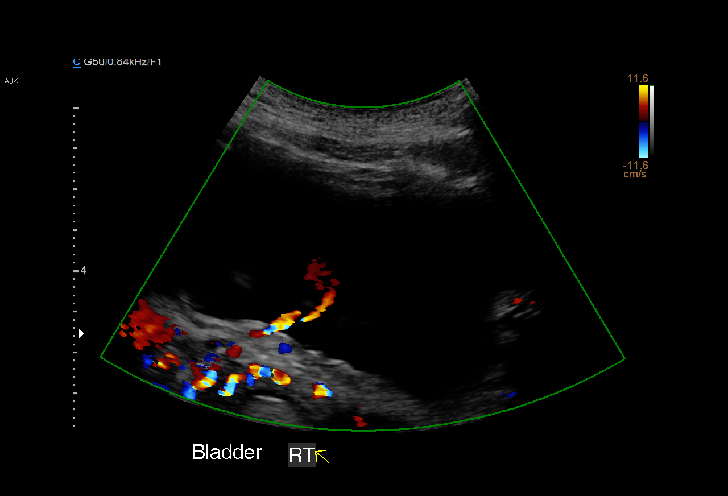
[im 27/39]
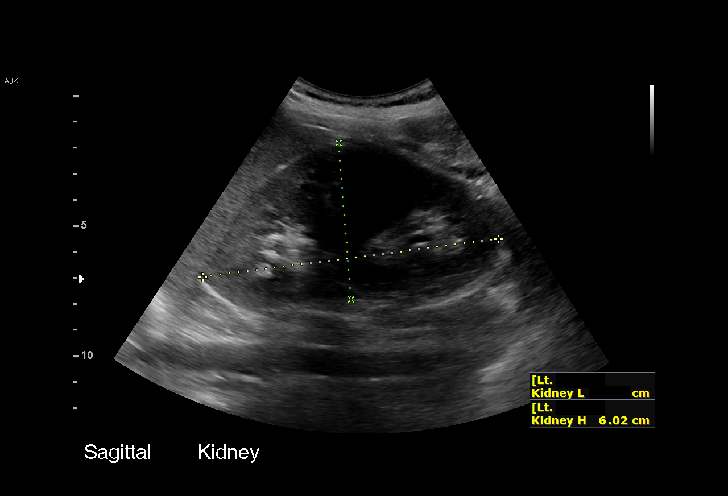
[im 31/39]
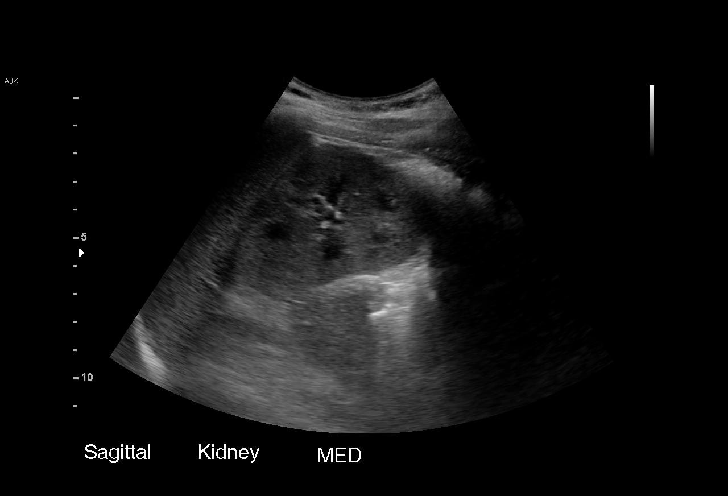
[im 32/39]
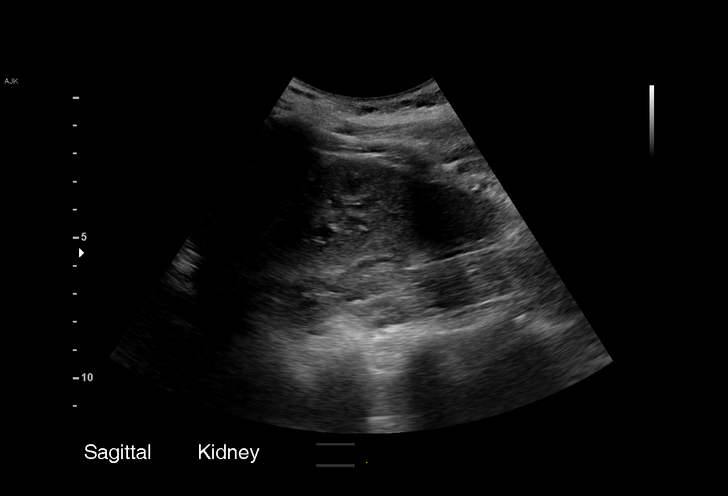
[im 35/39]
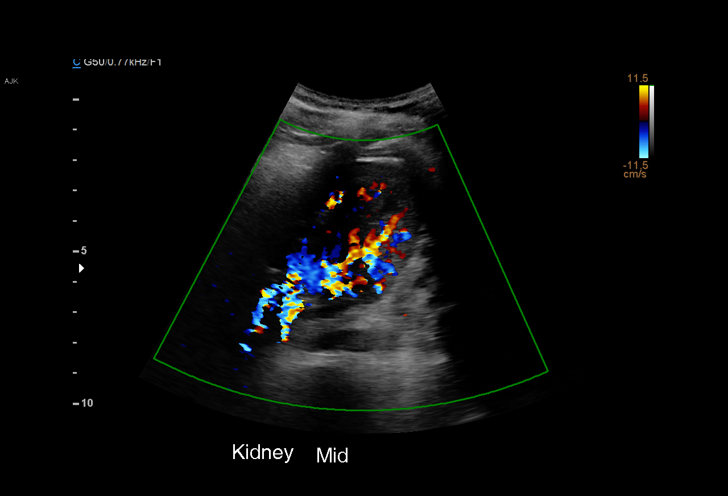
[im 39/39]
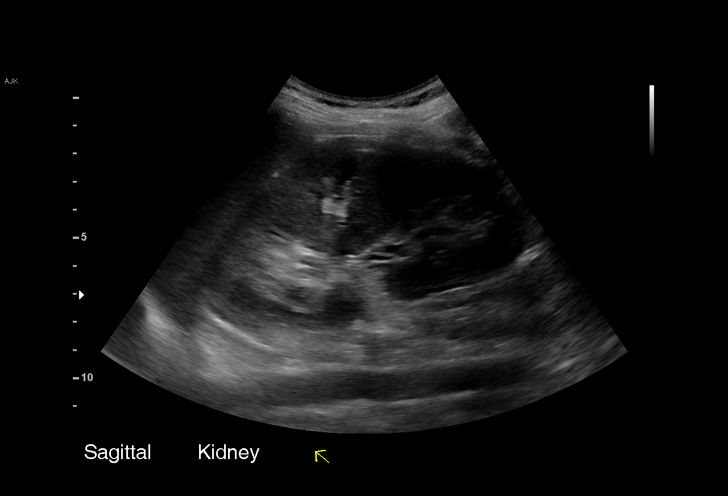

[15 of 25 positions shown; findings below may reference images not displayed]

FINDINGS: Right Kidney:

Renal measurements: 10.8 x 4.8 x 5.4 cm = volume: 146 mL .
Echogenicity within normal limits. No mass or hydronephrosis
visualized.

Left Kidney:

Renal measurements: 11.4 x 6.0 x 4.1 cm = volume: 149 mL.
Echogenicity within normal limits. No mass or hydronephrosis
visualized.

Bladder:

Appears normal for degree of bladder distention.

Other:

None.
IMPRESSION: No evidence for hydronephrosis.

## 2021-07-10 IMAGING — US US MFM OB DETAIL+14 WK
1 series · 13 of 28 positions shown · non-contrast
Comparison: none

[Series 1: us mfm ob detail+14 wk · 117 acquisitions, 13 frames shown]
[im 5/117]
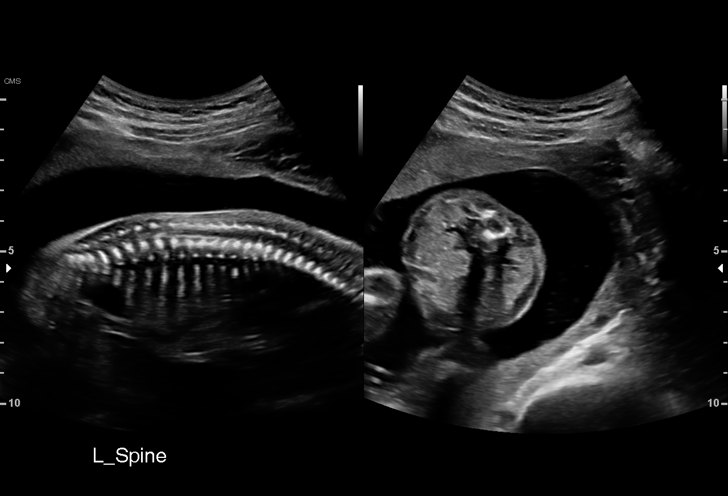
[im 13/117]
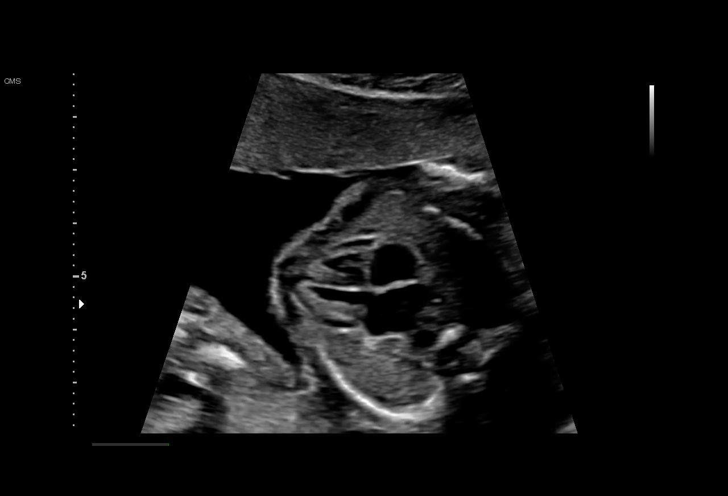
[im 22/117]
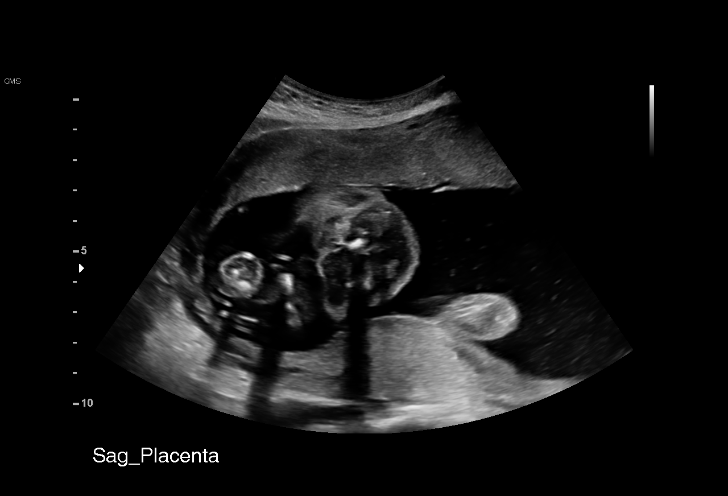
[im 31/117]
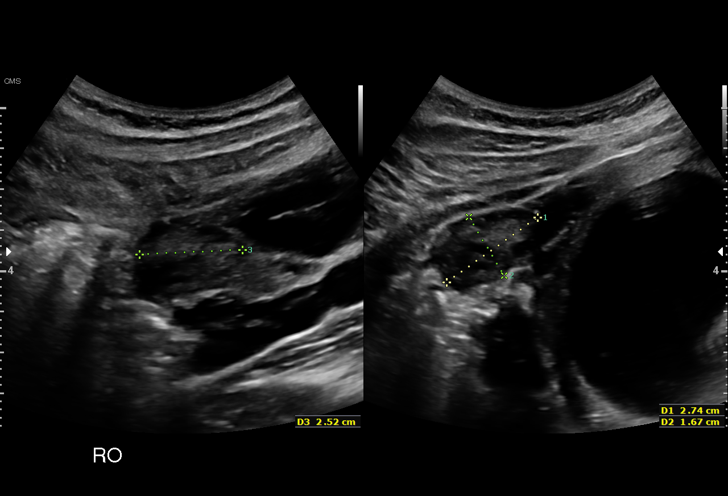
[im 39/117]
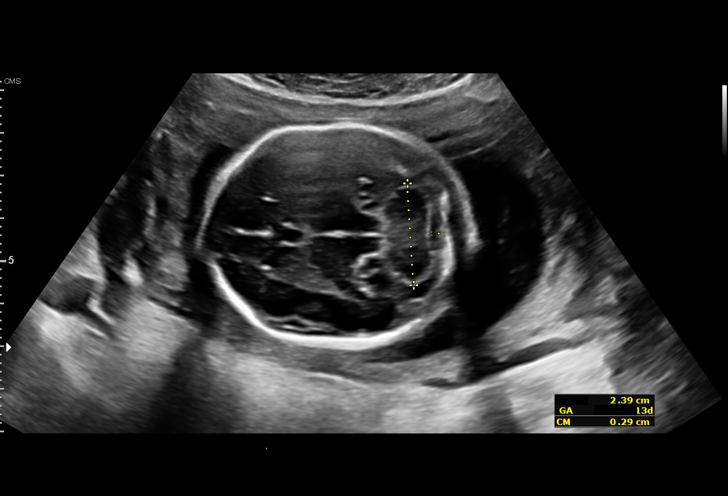
[im 48/117]
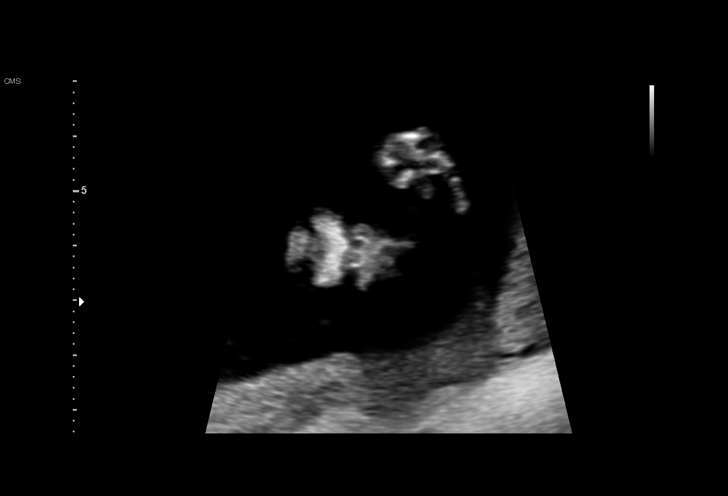
[im 61/117]
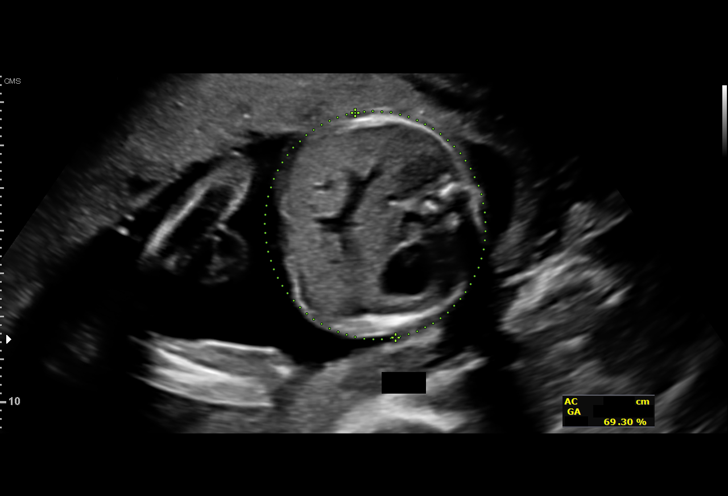
[im 69/117]
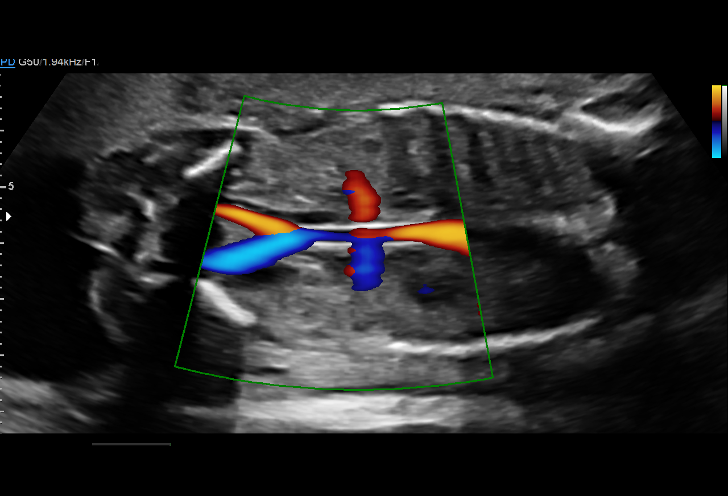
[im 78/117]
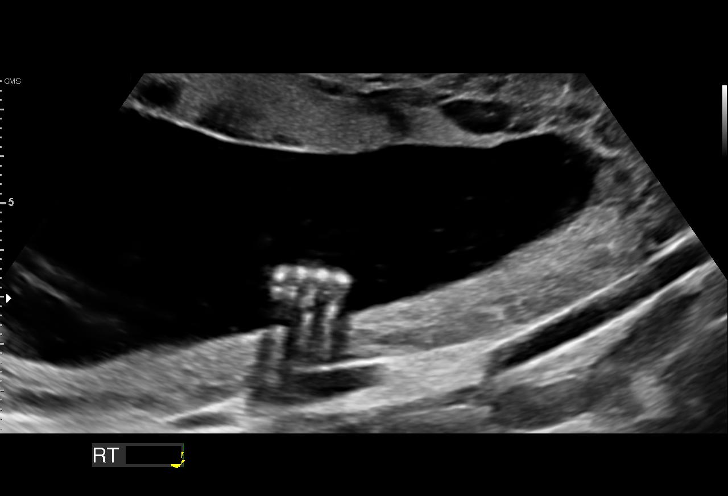
[im 86/117]
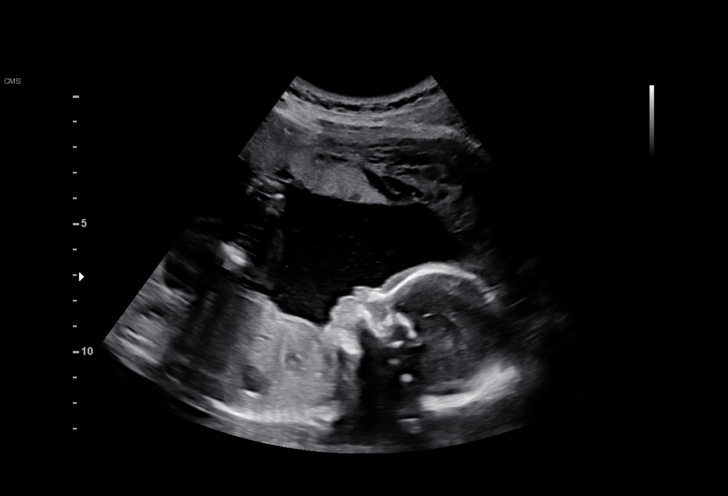
[im 95/117]
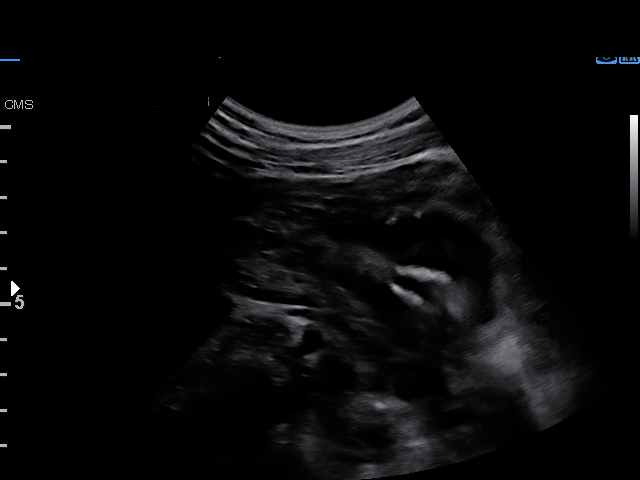
[im 104/117]
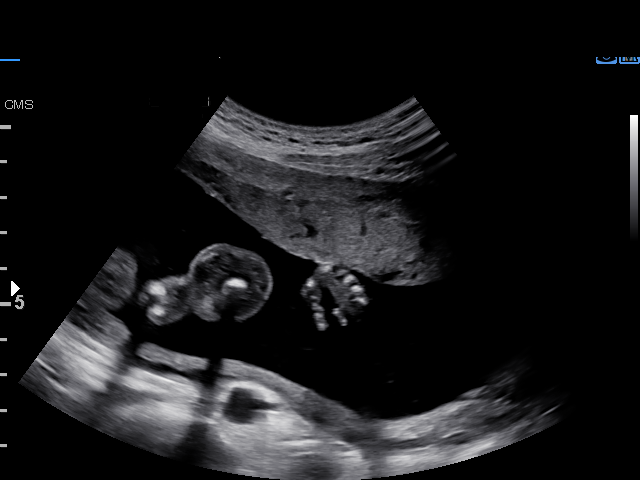
[im 112/117]
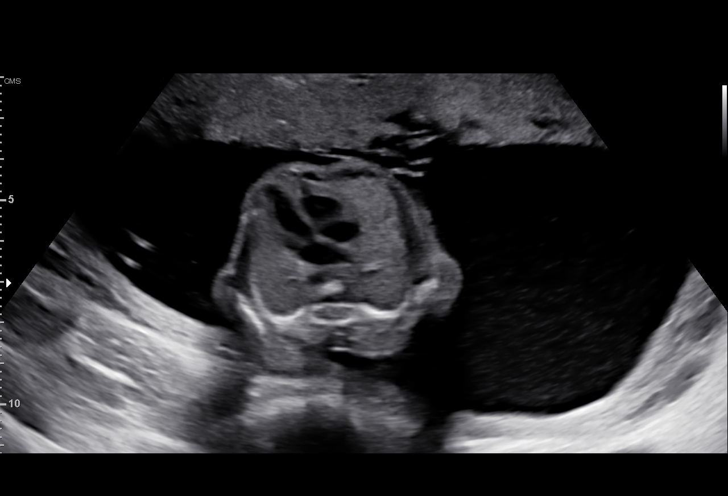

[13 of 28 positions shown; findings below may reference images not displayed]

----------------------------------------------------------------------

 ----------------------------------------------------------------------
Indications

  Fetal abnormality - other known or
  suspected (polydactyly)
  Antenatal screening for malformations
  Personal history of congenital abnormality
  (polydactyly)
  20 weeks gestation of pregnancy
 ----------------------------------------------------------------------
Fetal Evaluation

 Num Of Fetuses:         1
 Fetal Heart Rate(bpm):  154
 Cardiac Activity:       Observed
 Presentation:           Cephalic
 Placenta:               Anterior
 P. Cord Insertion:      Visualized

 Amniotic Fluid
 AFI FV:      Within normal limits

                             Largest Pocket(cm)

Biometry

 BPD:      50.3  mm     G. Age:  21w 2d         70  %    CI:        70.12   %    70 - 86
                                                         FL/HC:      18.1   %    15.9 -
 HC:      191.6  mm     G. Age:  21w 3d         72  %    HC/AC:      1.19        1.06 -
 AC:      161.4  mm     G. Age:  21w 2d         61  %    FL/BPD:     68.8   %
 FL:       34.6  mm     G. Age:  20w 6d         48  %    FL/AC:      21.4   %    20 - 24
 HUM:      33.2  mm     G. Age:  21w 1d         62  %
 CER:      23.9  mm     G. Age:  22w 0d         83  %
 CM:        2.9  mm
 Est. FW:     400  gm    0 lb 14 oz      67  %
OB History

 Gravidity:    2         Term:   0        Prem:   0        SAB:   1
 TOP:          0       Ectopic:  0        Living: 0
Gestational Age

 LMP:           20w 5d        Date:  12/23/18                 EDD:   09/29/19
 U/S Today:     21w 2d                                        EDD:   09/25/19
 Best:          20w 5d     Det. By:  LMP  (12/23/18)          EDD:   09/29/19
Anatomy

 Cranium:               Appears normal         Aortic Arch:            Appears normal
 Cavum:                 Appears normal         Ductal Arch:            Appears normal
 Ventricles:            Appears normal         Diaphragm:              Appears normal
 Choroid Plexus:        Appears normal         Stomach:                Appears normal, left
                                                                       sided
 Cerebellum:            Appears normal         Abdomen:                Appears normal
 Posterior Fossa:       Appears normal         Abdominal Wall:         Appears nml (cord
                                                                       insert, abd wall)
 Nuchal Fold:           Appears normal         Cord Vessels:           Appears normal (3
                                                                       vessel cord)
 Face:                  Appears normal         Kidneys:                Appear normal
                        (orbits and profile)
 Lips:                  Appears normal         Bladder:                Appears normal
 Thoracic:              Appears normal         Spine:                  Appears normal
 Heart:                 Appears normal         Upper Extremities:      Polydactyly
                        (4CH, axis, and
                        situs)
 RVOT:                  Appears normal         Lower Extremities:      Appears normal
 LVOT:                  Appears normal

 Other:  Heels/feet visualized. Nasal bone visualized.
Cervix Uterus Adnexa

 Cervix
 Length:            3.2  cm.
 Normal appearance by transabdominal scan.

 Uterus
 No abnormality visualized.

 Left Ovary
 Not visualized.

 Right Ovary
 Not visualized.

 Cul De Sac
 No free fluid seen.

 Adnexa
 No abnormality visualized.
Comments

 This patient was seen for a detailed fetal anatomy scan. She
 denies any significant past medical history and denies any
 problems in her current pregnancy.
 The patient has not had any screening test for fetal
 aneuploidy drawn in her current pregnancy.
 She was informed that the fetal growth and amniotic fluid
 level were appropriate for her gestational age.
 Probable polydactyly was noted on both of the fetal hands.
 The patient was advised that there is a high probability that
 her baby has 6 digits on his hands.  The patient reports that
 she was also born with 6 digits on both of her hands and
 therefore she is not concerned regarding today's ultrasound
 findings.  She was advised to have her baby examined after
 birth to determine the necessary treatment for the polydactyly
 noted today.
 The patient was informed that anomalies may be missed due
 to technical limitations. If the fetus is in a suboptimal position
 or maternal habitus is increased, visualization of the fetus in
 the maternal uterus may be impaired.
 Follow up as indicated.

## 2022-04-12 ENCOUNTER — Ambulatory Visit (HOSPITAL_COMMUNITY)
Admission: EM | Admit: 2022-04-12 | Discharge: 2022-04-12 | Disposition: A | Discharge status: Home / Self Care | Payer: Medicaid Other | Attending: Internal Medicine | Admitting: Internal Medicine

## 2022-04-12 ENCOUNTER — Encounter (HOSPITAL_COMMUNITY): Payer: Self-pay

## 2022-04-12 DIAGNOSIS — Z113 Encounter for screening for infections with a predominantly sexual mode of transmission: Secondary | ICD-10-CM | POA: Diagnosis present

## 2022-04-12 DIAGNOSIS — N760 Acute vaginitis: Secondary | ICD-10-CM | POA: Diagnosis present

## 2022-04-12 LAB — POCT URINALYSIS DIPSTICK, ED / UC
Bilirubin Urine: NEGATIVE
Glucose, UA: NEGATIVE mg/dL
Hgb urine dipstick: NEGATIVE
Ketones, ur: NEGATIVE mg/dL
Nitrite: NEGATIVE
Protein, ur: NEGATIVE mg/dL
Specific Gravity, Urine: <=1.005 (ref 1.005–1.030)
Urobilinogen, UA: 0.2 mg/dL (ref 0.0–1.0)
pH: 6 (ref 5.0–8.0)

## 2022-04-12 NOTE — ED Triage Notes (Signed)
Patient reports that she was diagnosed with trich a month ago and lost her prescription. Patient states she did not take the last 2 pills.  Patient states she is having yellow/brown vaginal discharge with an odor and lower abdominal pain.

## 2022-04-12 NOTE — Discharge Instructions (Addendum)
We have sent testing for sexually transmitted infections. We will notify you of any positive results once they are received. If required, we will prescribe any medication you might need.   Please refrain from all sexual activity until results received and, if needed, treatment completed

## 2022-04-12 NOTE — ED Provider Notes (Signed)
[place]    CSN: [id number] Arrival date & time: 04/12/22  1810      History   Chief Complaint No chief complaint on file.   HPI Erika Bailey is a 22 y.o. female presents to urgent care today with confirmed concern for STI.  Patient states her menstrual cycle ended yesterday but notes foul odor with normal vaginal discharge.  She reports occasional lower abdominal cramping which is not unusual during menstrual cycle.  Uncertain if any dysuria.  She denies any recent fever, chills, irregular vaginal bleeding or suspicious lesions.    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 Authorizing Provider  metroNIDAZOLE (FLAGYL) 500 MG tablet Take 1 tablet (500 mg total) by mouth 2 (two) times daily. 06/21/21   LampteyMyrene Galas, MD  medroxyPROGESTERone (DEPO-PROVERA) 150 MG/ML injection Inject 1 mL (150 mg total) into the muscle every 3 (three) months. 02/10/18 11/05/18  Woodroe Mode, MD    Family History Family History  Problem Relation Age of Onset   Healthy Mother    Healthy Father    Cancer Maternal  Grandmother     Social History Social History   Tobacco Use   Smoking status: Former    Types: Cigarettes    Quit date: 01/24/2019    Years since quitting: 3.2   Smokeless tobacco: Never  Vaping Use   Vaping Use: Never used  Substance Use Topics   Alcohol use: Yes    Alcohol/week: 1.0 standard drink of alcohol    Types: 1 Glasses of wine per week   Drug use: Never     Allergies   Peanuts [peanut oil]   Review of Systems As stated in HPI otherwise negative   Physical Exam Triage Vital Signs ED Triage Vitals  Enc Vitals Group     BP 04/12/22 1845 105/64     Pulse Rate 04/12/22 1845 64     Resp 04/12/22 1845 16     Temp 04/12/22 1845 98.4 F (36.9 C)     Temp Source 04/12/22 1845 Oral     SpO2 04/12/22 1845 98 %     Weight --      Height --      Head Circumference --      Peak Flow --      Pain Score 04/12/22  1847 5     Pain Loc --      Pain Edu? --      Excl. in [place]? --    No data found.  Updated Vital Signs BP 105/64 (BP Location: Left Arm)   Pulse 64   Temp 98.4 F (36.9 C) (Oral)   Resp 16   LMP 04/11/2022   SpO2 98%   Visual Acuity Right Eye Distance:   Left Eye Distance:   Bilateral Distance:    Right Eye Near:   Left Eye Near:    Bilateral Near:     Physical Exam Constitutional:      General: She is not in acute distress.    Appearance: Normal appearance. She is not ill-appearing or toxic-appearing.  HENT:     Mouth/Throat:     Mouth: Mucous membranes are moist.  Eyes:     Extraocular Movements: Extraocular movements intact.  Abdominal:     General: Bowel sounds are normal. There is no distension.     Palpations: Abdomen is soft.     Tenderness: There is no abdominal tenderness. There is no right CVA tenderness, left CVA tenderness, guarding or rebound.  Skin:    General: Skin is warm and dry.  Neurological:     General: No focal deficit present.     Mental Status: She is alert and oriented to person, place, and time.   Psychiatric:        Mood and Affect: Mood normal.        Behavior: Behavior normal.      UC Treatments / Results  Labs (all labs ordered are listed, but only abnormal results are displayed) Labs Reviewed  POCT URINALYSIS DIPSTICK, ED / UC - Abnormal; Notable for the following components:      Result Value   Leukocytes,Ua TRACE (*)    All other components within normal limits  URINE CULTURE  CERVICOVAGINAL ANCILLARY ONLY    EKG   Radiology No results found.  Procedures Procedures (including critical care time)  Medications Ordered in UC Medications - No data to display  Initial Impression / Assessment and Plan / UC Course  I have reviewed the triage vital signs and the nursing notes.  Pertinent labs & imaging results that were available during my care of the patient were reviewed by me and considered in my medical decision making (see chart for details).  Acute vaginitis STI screening -Await testing results to determine need for any treatment -In urine for culture to rule out UTI and need for antibiotic treatment -Strict follow-up precautions discussed  Reviewed expections re: course of current medical issues. Questions answered. Outlined signs and symptoms indicating need for more acute intervention. Pt verbalized understanding. AVS given  Final Clinical Impressions(s) / UC Diagnoses   Final diagnoses:  Acute vaginitis  Routine screening for STI (sexually transmitted infection)     Discharge Instructions      We have sent testing for sexually transmitted infections. We will notify you of any positive results once they are received. If required, we will prescribe any medication you might need.   Please refrain from all sexual activity until results received and, if needed, treatment completed      ED Prescriptions   None    PDMP not reviewed this encounter.   Rudolpho Sevin, NP 04/12/22 [phone]

## 2022-04-14 LAB — URINE CULTURE: Culture: 60000 — AB

## 2022-04-15 ENCOUNTER — Telehealth (HOSPITAL_COMMUNITY): Payer: Self-pay | Admitting: Emergency Medicine

## 2022-04-15 LAB — CERVICOVAGINAL ANCILLARY ONLY
Bacterial Vaginitis (gardnerella): POSITIVE — AB
Candida Glabrata: NEGATIVE
Candida Vaginitis: POSITIVE — AB
Chlamydia: NEGATIVE
Comment: NEGATIVE
Comment: NEGATIVE
Comment: NEGATIVE
Comment: NEGATIVE
Comment: NEGATIVE
Comment: NORMAL
Neisseria Gonorrhea: NEGATIVE
Trichomonas: NEGATIVE

## 2022-04-15 MED ORDER — METRONIDAZOLE 500 MG PO TABS: 500.0000 mg | ORAL_TABLET | Freq: Two times a day (BID) | ORAL | 0 refills | Status: DC

## 2022-04-15 MED ORDER — FLUCONAZOLE 150 MG PO TABS: 150.0000 mg | ORAL_TABLET | Freq: Once | ORAL | 0 refills | Status: AC

## 2023-03-12 NOTE — L&D Delivery Note (Signed)
 [facility] Delivery Note  Erika Bailey is a 23 y.o. [id number] s/p vag delivery at [redacted]w[redacted]d. She was admitted for IOL due to postdates.   ROM: 0h 20m with clear fluid GBS Status: neg Maximum Maternal Temperature: 98.8  Labor Progress: Erika Bailey was admitted for PDIOL; she had a dual dose of cytotec  followed by Pitocin , and was complete shortly after epidural placed at which time she had AROM.  Delivery Date/Time: September 28th, 2025 at 1500 Delivery: Called to room and patient recently had epidural placed and was and feeling pressure. We ruptured membranes and she began pushing well. Head delivered LOA. No nuchal cord present. Shoulder and body delivered in usual fashion. Infant with spontaneous cry, placed on mother's abdomen, dried and stimulated. Cord clamped x 2 after 1-minute delay, and cut by mother of patient. Cord blood drawn. Placenta delivered spontaneously with gentle cord traction. Fundus firm with massage and Pitocin . Labia, perineum, vagina, and cervix inspected and found to be intact.   Placenta: spont, intact; to L&D Complications: none Lacerations: none EBL: 428cc Analgesia: epidural  Postpartum Planning [x]  already had PP follow-up scheduled   Infant: boy  APGARs 9/9  3680g (8lb 1.8oz)  Erika Bailey, CNM  12/07/2023 3:28 PM

## 2023-03-29 ENCOUNTER — Other Ambulatory Visit: Payer: Self-pay

## 2023-03-29 ENCOUNTER — Encounter (HOSPITAL_BASED_OUTPATIENT_CLINIC_OR_DEPARTMENT_OTHER): Payer: Self-pay

## 2023-03-29 ENCOUNTER — Emergency Department (HOSPITAL_BASED_OUTPATIENT_CLINIC_OR_DEPARTMENT_OTHER)
Admission: EM | Admit: 2023-03-29 | Discharge: 2023-03-29 | Disposition: A | Discharge status: Home / Self Care | Payer: Medicaid Other | Attending: Emergency Medicine | Admitting: Emergency Medicine

## 2023-03-29 ENCOUNTER — Emergency Department (HOSPITAL_BASED_OUTPATIENT_CLINIC_OR_DEPARTMENT_OTHER): Payer: Medicaid Other

## 2023-03-29 DIAGNOSIS — R1031 Right lower quadrant pain: Secondary | ICD-10-CM

## 2023-03-29 DIAGNOSIS — Z91010 Allergy to peanuts: Secondary | ICD-10-CM | POA: Insufficient documentation

## 2023-03-29 DIAGNOSIS — O26891 Other specified pregnancy related conditions, first trimester: Secondary | ICD-10-CM | POA: Insufficient documentation

## 2023-03-29 DIAGNOSIS — Z3A01 Less than 8 weeks gestation of pregnancy: Secondary | ICD-10-CM | POA: Insufficient documentation

## 2023-03-29 LAB — URINALYSIS, W/ REFLEX TO CULTURE (INFECTION SUSPECTED)
Bacteria, UA: NONE SEEN
Bilirubin Urine: NEGATIVE
Glucose, UA: NEGATIVE mg/dL
Hgb urine dipstick: NEGATIVE
Ketones, ur: NEGATIVE mg/dL
Leukocytes,Ua: NEGATIVE
Nitrite: NEGATIVE
Protein, ur: NEGATIVE mg/dL
Specific Gravity, Urine: 1.032 — ABNORMAL HIGH (ref 1.005–1.030)
pH: 6 (ref 5.0–8.0)

## 2023-03-29 LAB — CBC WITH DIFFERENTIAL/PLATELET
Abs Immature Granulocytes: 0.01 10*3/uL (ref 0.00–0.07)
Basophils Absolute: 0.1 10*3/uL (ref 0.0–0.1)
Basophils Relative: 1 %
Eosinophils Absolute: 0.1 10*3/uL (ref 0.0–0.5)
Eosinophils Relative: 2 %
HCT: 40.5 % (ref 36.0–46.0)
Hemoglobin: 13.1 g/dL (ref 12.0–15.0)
Immature Granulocytes: 0 %
Lymphocytes Relative: 27 %
Lymphs Abs: 1.4 10*3/uL (ref 0.7–4.0)
MCH: 28.1 pg (ref 26.0–34.0)
MCHC: 32.3 g/dL (ref 30.0–36.0)
MCV: 86.7 fL (ref 80.0–100.0)
Monocytes Absolute: 0.5 10*3/uL (ref 0.1–1.0)
Monocytes Relative: 9 %
Neutro Abs: 3.3 10*3/uL (ref 1.7–7.7)
Neutrophils Relative %: 61 %
Platelets: 353 10*3/uL (ref 150–400)
RBC: 4.67 MIL/uL (ref 3.87–5.11)
RDW: 14.1 % (ref 11.5–15.5)
WBC: 5.3 10*3/uL (ref 4.0–10.5)
nRBC: 0 % (ref 0.0–0.2)

## 2023-03-29 LAB — COMPREHENSIVE METABOLIC PANEL
ALT: 7 U/L (ref 0–44)
AST: 15 U/L (ref 15–41)
Albumin: 4.5 g/dL (ref 3.5–5.0)
Alkaline Phosphatase: 63 U/L (ref 38–126)
Anion gap: 9 (ref 5–15)
BUN: 15 mg/dL (ref 6–20)
CO2: 25 mmol/L (ref 22–32)
Calcium: 9.8 mg/dL (ref 8.9–10.3)
Chloride: 103 mmol/L (ref 98–111)
Creatinine, Ser: 0.75 mg/dL (ref 0.44–1.00)
GFR, Estimated: >60 mL/min (ref >60)
Glucose, Bld: 90 mg/dL (ref 70–99)
Potassium: 4.2 mmol/L (ref 3.5–5.1)
Sodium: 137 mmol/L (ref 135–145)
Total Bilirubin: 0.4 mg/dL (ref 0.0–1.2)
Total Protein: 7.4 g/dL (ref 6.5–8.1)

## 2023-03-29 LAB — HCG, QUANTITATIVE, PREGNANCY: hCG, Beta Chain, Quant, S: 2015 m[IU]/mL — ABNORMAL HIGH (ref <5)

## 2023-03-29 NOTE — ED Triage Notes (Signed)
She tells me that her LNMP was Dec. 15, 2024. She is here today with lower abd. Cramping without fever or vaginal bleeding/spotting since yesterday. She is ambulatory and in no distress.

## 2023-03-29 NOTE — ED Provider Notes (Signed)
[place] EMERGENCY DEPARTMENT AT [facility] Provider Note   CSN: [id number] Arrival date & time: 03/29/23  1319     History  Chief Complaint  Patient presents with   Abdominal Pain    Erika Bailey is a 23 y.o. female.  Presenting to the ED for evaluation of lower abdominal pain.  This is bilateral.  Symptoms began yesterday and have worsened today.  Describes a sharp sensation.  It is intermittent but more constant today than it was yesterday.  Last menstrual period was 02/23/2023.  She states she recently took a pregnancy test and was positive.  She denies any vaginal bleeding, discharge, itching, odor.  She reports intermittent dysuria, no frequency or urgency.  No fevers or chills.  No nausea or vomiting.   Abdominal Pain      Home Medications Prior to Admission medications   Medication Sig Start Date End Date Taking? Authorizing Provider  metroNIDAZOLE (FLAGYL) 500 MG tablet Take 1 tablet (500 mg total) by mouth 2 (two) times daily. 04/15/22   Merrilee Jansky, MD  medroxyPROGESTERone (DEPO-PROVERA) 150 MG/ML injection Inject 1 mL (150 mg total) into the muscle every 3 (three) months. 02/10/18 11/05/18  Adam Phenix, MD      Allergies    Peanuts [peanut oil]    Review of Systems   Review of Systems  Gastrointestinal:  Positive for abdominal pain.  All other systems reviewed and are negative.   Physical Exam Updated Vital Signs BP 117/78   Pulse 86   Temp 98.2 F (36.8 C)   Resp 18   LMP 02/23/2023 (Exact Date)   SpO2 100%  Physical Exam Vitals and nursing note reviewed.  Constitutional:      General: She is not in acute distress.    Appearance: Normal appearance. She is normal weight. She is not ill-appearing.     Comments: Resting comfortably in bed  HENT:     Head: Normocephalic and atraumatic.  Pulmonary:     Effort: Pulmonary effort is normal. No respiratory distress.  Abdominal:     General: Abdomen is flat.     Tenderness: There  is no abdominal tenderness. There is no guarding.  Musculoskeletal:        General: Normal range of motion.     Cervical back: Neck supple.  Skin:    General: Skin is warm and dry.  Neurological:     Mental Status: She is alert and oriented to person, place, and time.  Psychiatric:        Mood and Affect: Mood normal.        Behavior: Behavior normal.     ED Results / Procedures / Treatments   Labs (all labs ordered are listed, but only abnormal results are displayed) Labs Reviewed  HCG, QUANTITATIVE, PREGNANCY - Abnormal; Notable for the following components:      Result Value   hCG, Beta Chain, Quant, S 2,015 (*)    All other components within normal limits  URINALYSIS, W/ REFLEX TO CULTURE (INFECTION SUSPECTED) - Abnormal; Notable for the following components:   Specific Gravity, Urine 1.032 (*)    All other components within normal limits  COMPREHENSIVE METABOLIC PANEL  CBC WITH DIFFERENTIAL/PLATELET    EKG None  Radiology US OB LESS THAN 14 WEEKS WITH OB TRANSVAGINAL Result Date: 03/29/2023 CLINICAL DATA:  Generalized pelvic pain for the past 2 days. Four weeks and 6 days pregnant by last menstrual period. Quantitative beta HCG 2015. EXAM: OBSTETRIC <14 WK  [place] AND TRANSVAGINAL OB US TECHNIQUE: Both transabdominal and transvaginal ultrasound examinations were performed for complete evaluation of the gestation as well as the maternal uterus, adnexal regions, and pelvic cul-de-sac. Transvaginal technique was performed to assess early pregnancy. COMPARISON:  None Available. FINDINGS: Intrauterine gestational sac: Visualized Yolk sac:  Visualized Embryo:  Not visualized MSD: 4.7 mm mm   5 w   2 d Subchorionic hemorrhage:  None visualized. Maternal uterus/adnexae: Normal-appearing ovaries with a right ovarian corpus luteum noted. No abnormal free peritoneal fluid. IMPRESSION: Intrauterine gestational sac containing a normal-appearing yolk sac with no fetal pole seen at this time. The  estimated gestational age by mean sac diameter is 5 weeks and 2 days. This could represent a normal early intrauterine pregnancy. Recommend follow-up quantitative B-HCG levels and follow-up US in 14 days to assess viability. This recommendation follows SRU consensus guidelines: Diagnostic Criteria for Nonviable Pregnancy Early in the First Trimester. Malva Limes Med 2013; 161:0960-45. Electronically Signed   By: Beckie Salts M.D.   On: 03/29/2023 17:07    Procedures Procedures    Medications Ordered in ED Medications - No data to display  ED Course/ Medical Decision Making/ A&P                                 Medical Decision Making Amount and/or Complexity of Data Reviewed Labs: ordered. Radiology: ordered.  This patient presents to the ED for concern of lower abdominal cramping, positive pregnancy test, this involves an extensive number of treatment options, and is a complaint that carries with it a high risk of complications and morbidity.  Differential diagnosis of her lower abdominal considerations include pelvic inflammatory disease, ectopic pregnancy, appendicitis, urinary calculi, primary dysmenorrhea, septic abortion, ruptured ovarian cyst or tumor, ovarian torsion, tubo-ovarian abscess, degeneration of fibroid, endometriosis, diverticulitis, cystitis.   My initial workup includes labs, ultrasound.  Patient Clines pain medication  Additional history obtained from: Nursing notes from this visit.  I ordered, reviewed and interpreted labs which include: CBC, CMP, hCG, urinalysis.  hCG of 2015, labs otherwise reassuring  I ordered imaging studies including OB ultrasound I independently visualized and interpreted imaging which showed gestational sac measuring 5 weeks 2 days, no fetal pole with recommendation to follow-up in 14 days for repeat hCG and ultrasound I agree with the radiologist interpretation  Afebrile, hemodynamically stable.  23 year old female presenting to the ED for  evaluation of lower abdominal cramping and positive pregnancy test.  No vaginal bleeding.  Cramping is mild.  hCG of 2015.  Urine within normal limits.  No anemia or leukocytosis.  Ultrasound confirms intrauterine pregnancy, recommends repeat ultrasound in 14 days with repeat hCG.  Patient states she has seen [facility] gynecology in the past.  She was encouraged to call to schedule follow-up appointment in 2 weeks for repeat labs and imaging.  She is also requesting contact information for occupational health due to work restrictions being placed from pregnancy.  Contact information for current occupational health was provided.  She was given return precautions.  Stable at discharge.  At this time there does not appear to be any evidence of an acute emergency medical condition and the patient appears stable for discharge with appropriate outpatient follow up. Diagnosis was discussed with patient who verbalizes understanding of care plan and is agreeable to discharge. I have discussed return precautions with patient who verbalizes understanding. Patient encouraged to follow-up with their PCP within  2 weeks. All questions answered.  Note: Portions of this report may have been transcribed using voice recognition software. Every effort was made to ensure accuracy; however, inadvertent computerized transcription errors may still be present.         Final Clinical Impression(s) / ED Diagnoses Final diagnoses:  Less than [redacted] weeks gestation of pregnancy  Bilateral lower abdominal cramping    Rx / DC Orders ED Discharge Orders     None         Michelle Piper, Cordelia Poche 03/29/23 1739    Rondel Baton, MD 04/02/23 1246

## 2023-03-29 NOTE — Discharge Instructions (Signed)
You have been seen today for your complaint of lower abdominal cramping. Your lab work confirmed pregnancy. Your imaging confirmed a gestational sac with recommendation for repeat ultrasound in 2 weeks. Follow up with: [facility] gynecology.  Call to schedule follow-up appointment in 2 weeks for repeat blood work and ultrasound Please seek immediate medical care if you develop any of the following symptoms: You have trouble breathing or chest pain. You have any kind of injury, such as from a fall or a car crash. At this time there does not appear to be the presence of an emergent medical condition, however there is always the potential for conditions to change. Please read and follow the below instructions.  Do not take your medicine if  develop an itchy rash, swelling in your mouth or lips, or difficulty breathing; call 911 and seek immediate emergency medical attention if this occurs.  You may review your lab tests and imaging results in their entirety on your MyChart account.  Please discuss all results of fully with your primary care provider and other specialist at your follow-up visit.  Note: Portions of this text may have been transcribed using voice recognition software. Every effort was made to ensure accuracy; however, inadvertent computerized transcription errors may still be present.

## 2023-04-24 ENCOUNTER — Encounter: Payer: Self-pay | Admitting: *Deleted

## 2023-04-24 ENCOUNTER — Other Ambulatory Visit: Payer: Self-pay | Admitting: *Deleted

## 2023-04-25 ENCOUNTER — Ambulatory Visit: Payer: Medicaid Other | Admitting: *Deleted

## 2023-04-25 ENCOUNTER — Other Ambulatory Visit (INDEPENDENT_AMBULATORY_CARE_PROVIDER_SITE_OTHER): Payer: Self-pay

## 2023-04-25 VITALS — BP 109/64 | HR 70

## 2023-04-25 DIAGNOSIS — O3680X0 Pregnancy with inconclusive fetal viability, not applicable or unspecified: Secondary | ICD-10-CM

## 2023-04-25 DIAGNOSIS — Z3A08 8 weeks gestation of pregnancy: Secondary | ICD-10-CM | POA: Diagnosis not present

## 2023-04-25 DIAGNOSIS — Z3481 Encounter for supervision of other normal pregnancy, first trimester: Secondary | ICD-10-CM

## 2023-04-25 DIAGNOSIS — Z1339 Encounter for screening examination for other mental health and behavioral disorders: Secondary | ICD-10-CM | POA: Diagnosis not present

## 2023-04-25 DIAGNOSIS — Z3480 Encounter for supervision of other normal pregnancy, unspecified trimester: Secondary | ICD-10-CM | POA: Insufficient documentation

## 2023-04-25 MED ORDER — BLOOD PRESSURE KIT DEVI: 1.0000 | 0 refills | Status: DC

## 2023-04-25 MED ORDER — PRENATE PIXIE 10-0.6-0.4-200 MG PO CAPS: 1.0000 | ORAL_CAPSULE | Freq: Every day | ORAL | 11 refills | Status: AC

## 2023-04-25 NOTE — Progress Notes (Signed)
New OB Intake  I connected with Erika Bailey  on 04/25/23 at  9:15 AM EST by In Person Visit and verified that I am speaking with the correct person using two identifiers. Nurse is located at [facility] and pt is located at [place].  I discussed the limitations, risks, security and privacy concerns of performing an evaluation and management service by telephone and the availability of in person appointments. I also discussed with the patient that there may be a patient responsible charge related to this service. The patient expressed understanding and agreed to proceed.  I explained I am completing New OB Intake today. We discussed EDD of 11/30/2023, by Last Menstrual Period. Pt is G4P1021. I reviewed her allergies, medications and Medical/Surgical/OB history.    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. Discussed the nature of our practice with multiple providers including residents and students. Due to the size of the practice, the delivering provider may not be the same as those providing prenatal care.   Patient is not interested in water birth. Offered upcoming OB visit with CNM to discuss further.  MyChart/Babyscripts MyChart access verified. I explained pt will have some visits in office and some virtually. Babyscripts instructions given and order placed. Patient verifies receipt of registration text/e-mail. Account successfully created and app downloaded. If patient is a candidate for Optimized scheduling, add to sticky note.   Blood Pressure Cuff/Weight Scale Blood pressure cuff ordered for patient to pick-up from [employer]. Explained after first prenatal appt pt will check weekly and document in Babyscripts. Patient does not  have weight scale; patient may purchase if they desire to track weight weekly in Babyscripts.  Anatomy US Explained first scheduled [place] will be around 19 weeks. Anatomy US scheduled for TBD at TBD.  Interested in [place]? If yes, send referral and doula dot phrase.   Is patient a candidate for Babyscripts Optimization? Yes, patient accepted    First visit review I reviewed new OB appt with patient. Explained pt will be seen by Clement Sayres, PA at first visit. Discussed Avelina Laine genetic screening with patient. Requests Panorama and Horizon.. Routine prenatal labs  not collected at today's visit.    Last Pap Diagnosis  Date Value Ref Range Status  09/06/2019   Final   - Negative for intraepithelial lesion or malignancy (NILM)    Harrel Lemon, RN 04/25/2023  10:04 AM

## 2023-04-28 NOTE — Patient Instructions (Signed)

## 2023-05-16 ENCOUNTER — Encounter: Payer: Medicaid Other | Admitting: Physician Assistant

## 2023-05-18 NOTE — Progress Notes (Unsigned)
   PRENATAL VISIT NOTE  Subjective:  Erika Bailey is a 23 y.o. [phone] at [redacted]w[redacted]d being seen today for her first prenatal visit for this pregnancy.  She is currently monitored for the following issues for this {Blank single:19197::"high-risk","low-risk"} pregnancy and has History of ELISA positive for HSV; Group B Streptococcus carrier, +RV culture, currently pregnant; Encounter for induction of labor; and Supervision of other normal pregnancy, antepartum on their problem list.  Patient reports {sx:14538}.   .  .   . Denies leaking of fluid.   She is planning to {Blank single:19197::"breastfeed","bottle feed"}. Desires *** for contraception.   The following portions of the patient's history were reviewed and updated as appropriate: allergies, current medications, past family history, past medical history, past social history, past surgical history and problem list.   Objective:  There were no vitals filed for this visit.  Fetal Status:           General:  Alert, oriented and cooperative. Patient is in no acute distress.  Skin: Skin is warm and dry. No rash noted.   Cardiovascular: Normal heart rate and rhythm noted  Respiratory: Normal respiratory effort, no problems with respiration noted. Clear to auscultation.   Abdomen: Soft, gravid, appropriate for gestational age. Normal bowel sounds. Non-tender.       Pelvic: Cervical exam deferred       Normal cervical contour, no lesions, no bleeding following pap, normal discharge  Extremities: Normal range of motion.     Mental Status: Normal mood and affect. Normal behavior. Normal judgment and thought content.    Indications for ASA therapy (per uptodate) One of the following: Previous pregnancy with preeclampsia, especially early onset and with an adverse outcome {yes/no:20286} Multifetal gestation {yes/no:20286} Chronic hypertension {yes/no:20286} Type 1 or 2 diabetes mellitus {yes/no:20286} Chronic kidney disease  {yes/no:20286} Autoimmune disease (antiphospholipid syndrome, systemic lupus erythematosus) {yes/no:20286}  Two or more of the following: Nulliparity No Obesity (body mass index >30 kg/m2) {yes/no:20286} Family history of preeclampsia in mother or sister {yes/no:20286} Age >=35 years No Sociodemographic characteristics (African American race, low socioeconomic level) Yes Personal risk factors (eg, previous pregnancy with low birth weight or small for gestational age infant, previous adverse pregnancy outcome [eg, stillbirth], interval >10 years between pregnancies) {yes/no:20286}  Assessment and Plan:  Pregnancy: G4P1021 at [redacted]w[redacted]d  1. Supervision of other normal pregnancy, antepartum (Primary) Initial labs drawn. Continue prenatal vitamins. Genetic Screening discussed: NIPS, carrier screening and AFP *** Ultrasound discussed; fetal anatomic survey: *** Problem list reviewed and updated. Reviewed Brx optimized schedule, patient agreeable The nature of [facility] with multiple MDs and other Advanced Practice Providers was explained to patient; also emphasized that residents, students are part of our team. Routine obstetric precautions reviewed.   2. [redacted] weeks gestation of pregnancy Anticipatory guidance about next visits/weeks of pregnancy given.   3. GBS (group B Streptococcus carrier), +RV culture, currently pregnant Intrapartum ABX   Preterm labor/first trimester warning symptoms and general obstetric precautions including but not limited to vaginal bleeding, contractions, leaking of fluid and fetal movement were reviewed in detail with the patient.  Please refer to After Visit Summary for other counseling recommendations.   No follow-ups on file.  Future Appointments  Date Time Provider Department Center  05/21/2023 10:15 AM Ralene Muskrat, [place] [facility] None  07/07/2023  1:15 PM [facility] NURSE [facility]  07/07/2023  1:30 PM [facility] US5  [facility]    Ralene Muskrat, PA-C

## 2023-05-21 ENCOUNTER — Ambulatory Visit: Payer: Medicaid Other | Admitting: Physician Assistant

## 2023-05-21 ENCOUNTER — Other Ambulatory Visit (HOSPITAL_COMMUNITY)
Admission: RE | Admit: 2023-05-21 | Discharge: 2023-05-21 | Disposition: A | Discharge status: Home / Self Care | Source: Ambulatory Visit | Attending: Physician Assistant | Admitting: Physician Assistant

## 2023-05-21 ENCOUNTER — Encounter: Payer: Self-pay | Admitting: Physician Assistant

## 2023-05-21 VITALS — BP 97/64 | HR 77 | Wt 171.0 lb

## 2023-05-21 DIAGNOSIS — O99820 Streptococcus B carrier state complicating pregnancy: Secondary | ICD-10-CM | POA: Insufficient documentation

## 2023-05-21 DIAGNOSIS — Z3A12 12 weeks gestation of pregnancy: Secondary | ICD-10-CM

## 2023-05-21 DIAGNOSIS — Z124 Encounter for screening for malignant neoplasm of cervix: Secondary | ICD-10-CM | POA: Diagnosis not present

## 2023-05-21 DIAGNOSIS — Z3481 Encounter for supervision of other normal pregnancy, first trimester: Secondary | ICD-10-CM

## 2023-05-21 DIAGNOSIS — Z3480 Encounter for supervision of other normal pregnancy, unspecified trimester: Secondary | ICD-10-CM

## 2023-05-21 NOTE — Progress Notes (Signed)
 Taking Unisom over the counter.

## 2023-05-22 ENCOUNTER — Encounter: Payer: Self-pay | Admitting: Physician Assistant

## 2023-05-22 LAB — HEMOGLOBIN A1C
Est. average glucose Bld gHb Est-mCnc: 105 mg/dL
Hgb A1c MFr Bld: 5.3 % (ref 4.8–5.6)

## 2023-05-22 LAB — CBC/D/PLT+RPR+RH+ABO+RUBIGG...
Antibody Screen: NEGATIVE
Basophils Absolute: 0 10*3/uL (ref 0.0–0.2)
Basos: 1 %
EOS (ABSOLUTE): 0.1 10*3/uL (ref 0.0–0.4)
Eos: 2 %
HCV Ab: NONREACTIVE
HIV Screen 4th Generation wRfx: NONREACTIVE
Hematocrit: 37.7 % (ref 34.0–46.6)
Hemoglobin: 12.3 g/dL (ref 11.1–15.9)
Hepatitis B Surface Ag: NEGATIVE
Immature Grans (Abs): 0 10*3/uL (ref 0.0–0.1)
Immature Granulocytes: 0 %
Lymphocytes Absolute: 1.5 10*3/uL (ref 0.7–3.1)
Lymphs: 20 %
MCH: 28.3 pg (ref 26.6–33.0)
MCHC: 32.6 g/dL (ref 31.5–35.7)
MCV: 87 fL (ref 79–97)
Monocytes Absolute: 0.4 10*3/uL (ref 0.1–0.9)
Monocytes: 6 %
Neutrophils Absolute: 5.2 10*3/uL (ref 1.4–7.0)
Neutrophils: 71 %
Platelets: 277 10*3/uL (ref 150–450)
RBC: 4.34 x10E6/uL (ref 3.77–5.28)
RDW: 13.5 % (ref 11.7–15.4)
RPR Ser Ql: NONREACTIVE
Rh Factor: POSITIVE
Rubella Antibodies, IGG: 6.32 {index} (ref >0.99)
WBC: 7.3 10*3/uL (ref 3.4–10.8)

## 2023-05-22 LAB — CERVICOVAGINAL ANCILLARY ONLY
Chlamydia: NEGATIVE
Comment: NEGATIVE
Comment: NEGATIVE
Comment: NORMAL
Neisseria Gonorrhea: NEGATIVE
Trichomonas: NEGATIVE

## 2023-05-22 LAB — CYTOLOGY - PAP: Diagnosis: NEGATIVE

## 2023-05-22 LAB — HCV INTERPRETATION

## 2023-05-23 LAB — CULTURE, OB URINE

## 2023-05-23 LAB — URINE CULTURE, OB REFLEX

## 2023-05-28 ENCOUNTER — Encounter: Payer: Self-pay | Admitting: Physician Assistant

## 2023-05-28 LAB — PANORAMA PRENATAL TEST FULL PANEL:PANORAMA TEST PLUS 5 ADDITIONAL MICRODELETIONS: FETAL FRACTION: 4.7

## 2023-06-03 LAB — HORIZON CUSTOM: REPORT SUMMARY: POSITIVE — AB

## 2023-06-19 ENCOUNTER — Ambulatory Visit (INDEPENDENT_AMBULATORY_CARE_PROVIDER_SITE_OTHER): Admitting: Obstetrics and Gynecology

## 2023-06-19 ENCOUNTER — Encounter: Payer: Self-pay | Admitting: Obstetrics and Gynecology

## 2023-06-19 VITALS — BP 112/65 | HR 73 | Wt 174.4 lb

## 2023-06-19 DIAGNOSIS — Z3A16 16 weeks gestation of pregnancy: Secondary | ICD-10-CM

## 2023-06-19 DIAGNOSIS — Z3480 Encounter for supervision of other normal pregnancy, unspecified trimester: Secondary | ICD-10-CM | POA: Diagnosis not present

## 2023-06-19 NOTE — Progress Notes (Signed)
   PRENATAL VISIT NOTE  Subjective:  Erika Bailey is a 23 y.o. [phone] at [redacted]w[redacted]d being seen today for ongoing prenatal care.  She is currently monitored for the following issues for this low-risk pregnancy and has History of ELISA positive for HSV and Supervision of other normal pregnancy, antepartum on their problem list.  Patient reports no complaints.  Contractions: Not present. Vag. Bleeding: None.  Movement: Present. Denies leaking of fluid.   The following portions of the patient's history were reviewed and updated as appropriate: allergies, current medications, past family history, past medical history, past social history, past surgical history and problem list.   Objective:   Vitals:   06/19/23 1048  BP: 112/65  Pulse: 73  Weight: 174 lb 6.4 oz (79.1 kg)    Fetal Status: Fetal Heart Rate (bpm): 154   Movement: Present     General:  Alert, oriented and cooperative. Patient is in no acute distress.  Skin: Skin is warm and dry. No rash noted.   Cardiovascular: Normal heart rate noted  Respiratory: Normal respiratory effort, no problems with respiration noted  Abdomen: Soft, gravid, appropriate for gestational age.  Pain/Pressure: Present     Pelvic: Cervical exam deferred        Extremities: Normal range of motion.  Edema: None  Mental Status: Normal mood and affect. Normal behavior. Normal judgment and thought content.   Assessment and Plan:  Pregnancy: G4P1021 at [redacted]w[redacted]d 1. Supervision of other normal pregnancy, antepartum (Primary) Patient is doing well with round ligament pain- reassurance provided AFP today Anatomy ultrasound scheduled on 4/28  Preterm labor symptoms and general obstetric precautions including but not limited to vaginal bleeding, contractions, leaking of fluid and fetal movement were reviewed in detail with the patient. Please refer to After Visit Summary for other counseling recommendations.   Return in about 4 weeks (around 07/17/2023).  Future  Appointments  Date Time Provider Department Center  07/07/2023  1:15 PM [facility] NURSE [facility]  07/07/2023  1:30 PM [facility] US5 [facility]    Catalina Antigua, MD

## 2023-06-19 NOTE — Progress Notes (Signed)
 Pt presents for ROB visit. Pt c/o abd cramping.

## 2023-06-21 LAB — AFP, SERUM, OPEN SPINA BIFIDA
AFP MoM: 1.23
AFP Value: 34.3 ng/mL
Gest. Age on Collection Date: 16.4 wk
Maternal Age At EDD: 25.7 a
OSBR Risk 1 IN: 3538
Test Results:: NEGATIVE
Weight: 174 [lb_av]

## 2023-06-24 ENCOUNTER — Encounter: Payer: Self-pay | Admitting: Obstetrics & Gynecology

## 2023-06-24 DIAGNOSIS — D563 Thalassemia minor: Secondary | ICD-10-CM | POA: Insufficient documentation

## 2023-06-24 HISTORY — DX: Thalassemia minor: D56.3

## 2023-07-07 ENCOUNTER — Ambulatory Visit: Payer: Medicaid Other | Attending: Obstetrics and Gynecology

## 2023-07-07 ENCOUNTER — Ambulatory Visit: Payer: Medicaid Other

## 2023-07-07 ENCOUNTER — Other Ambulatory Visit: Payer: Self-pay | Admitting: *Deleted

## 2023-07-07 ENCOUNTER — Ambulatory Visit (HOSPITAL_BASED_OUTPATIENT_CLINIC_OR_DEPARTMENT_OTHER): Admitting: Obstetrics

## 2023-07-07 VITALS — BP 105/56 | HR 76

## 2023-07-07 DIAGNOSIS — Z3A19 19 weeks gestation of pregnancy: Secondary | ICD-10-CM

## 2023-07-07 DIAGNOSIS — O352XX0 Maternal care for (suspected) hereditary disease in fetus, not applicable or unspecified: Secondary | ICD-10-CM | POA: Diagnosis not present

## 2023-07-07 DIAGNOSIS — O280 Abnormal hematological finding on antenatal screening of mother: Secondary | ICD-10-CM

## 2023-07-07 DIAGNOSIS — O4402 Placenta previa specified as without hemorrhage, second trimester: Secondary | ICD-10-CM

## 2023-07-07 DIAGNOSIS — Z3480 Encounter for supervision of other normal pregnancy, unspecified trimester: Secondary | ICD-10-CM

## 2023-07-07 DIAGNOSIS — O358XX0 Maternal care for other (suspected) fetal abnormality and damage, not applicable or unspecified: Secondary | ICD-10-CM | POA: Diagnosis present

## 2023-07-07 DIAGNOSIS — Z148 Genetic carrier of other disease: Secondary | ICD-10-CM | POA: Insufficient documentation

## 2023-07-07 DIAGNOSIS — O4412 Placenta previa with hemorrhage, second trimester: Secondary | ICD-10-CM | POA: Insufficient documentation

## 2023-07-07 DIAGNOSIS — Z363 Encounter for antenatal screening for malformations: Secondary | ICD-10-CM | POA: Insufficient documentation

## 2023-07-07 NOTE — Progress Notes (Signed)
 MFM Consult Note  Erika Bailey is currently at 19 weeks and 1 day.  She was seen as her Horizon screening test indicated that she is a silent carrier for alpha thalassemia.  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  Her prior child had polydactyly.  She denies any significant past medical history and denies any problems in her current pregnancy.    She had a cell free DNA test earlier in her pregnancy which indicated a low risk for trisomy 72, 37, and 13. A female fetus is predicted.   She was informed that the fetal growth and amniotic fluid level were appropriate for her gestational age.   On today's exam, an intracardiac echogenic focus was noted in the left ventricle of the fetal heart.    The small association between an echogenic focus and Down syndrome was discussed.   Due to the echogenic focus noted today, the patient was offered and declined an amniocentesis today for definitive diagnosis of fetal aneuploidy.  She reports that she is comfortable with her negative cell free DNA test.  There were no signs of polydactyly noted in her fetus today.  The patient was informed that anomalies may be missed due to technical limitations. If the fetus is in a suboptimal position or maternal habitus is increased, visualization of the fetus in the maternal uterus may be impaired.  A posterior placenta previa was noted on today's exam.  The patient was reassured that most cases of placenta previa will resolve later in her pregnancy.    She understands that should placenta previa persist at term, she will require a cesarean delivery.  A follow-up exam was scheduled in 6 weeks to assess the fetal growth and placental location.  I have also made arrangements for her to meet with our genetic counselor in 6 weeks.  The patient will bring her partner who lives in [place] so that he can get screened to determine if he is also a carrier for alpha thalassemia  when she returns for her next visit.  She stated that all of her questions were answered today.  A total of 45 minutes was spent counseling and coordinating the care for this patient.  Greater than 50% of the time was spent in direct face-to-face contact.

## 2023-07-17 ENCOUNTER — Ambulatory Visit: Admitting: Obstetrics & Gynecology

## 2023-07-17 VITALS — BP 107/73 | HR 80 | Wt 177.7 lb

## 2023-07-17 DIAGNOSIS — D563 Thalassemia minor: Secondary | ICD-10-CM

## 2023-07-17 DIAGNOSIS — Z3480 Encounter for supervision of other normal pregnancy, unspecified trimester: Secondary | ICD-10-CM | POA: Diagnosis not present

## 2023-07-17 DIAGNOSIS — O4400 Placenta previa specified as without hemorrhage, unspecified trimester: Secondary | ICD-10-CM | POA: Diagnosis not present

## 2023-07-17 NOTE — Progress Notes (Signed)
 Pt presents for rob. Pt has no questions or concerns at this time.

## 2023-07-17 NOTE — Progress Notes (Signed)
   PRENATAL VISIT NOTE  Subjective:  Erika Bailey is a 23 y.o. [phone] at [redacted]w[redacted]d being seen today for ongoing prenatal care.  She is currently monitored for the following issues for this high-risk pregnancy and has History of ELISA positive for HSV; Supervision of other normal pregnancy, antepartum; Alpha thalassemia silent carrier; and Placenta previa antepartum on their problem list.  Patient reports no complaints.  Contractions: Not present. Vag. Bleeding: None.  Movement: Present. Denies leaking of fluid.   The following portions of the patient's history were reviewed and updated as appropriate: allergies, current medications, past family history, past medical history, past social history, past surgical history and problem list.   Objective:   Vitals:   07/17/23 1043  BP: 107/73  Pulse: 80  Weight: 177 lb 11.2 oz (80.6 kg)     Fetal Status: Fetal Heart Rate (bpm): 165   Movement: Present      General: Alert, oriented and cooperative. Patient is in no acute distress.  Skin: Skin is warm and dry. No rash noted.   Cardiovascular: Normal heart rate noted  Respiratory: Normal respiratory effort, no problems with respiration noted  Abdomen: Soft, gravid, appropriate for gestational age.  Pain/Pressure: Present     Pelvic: Cervical exam deferred        Extremities: Normal range of motion.  Edema: None  Mental Status: Normal mood and affect. Normal behavior. Normal judgment and thought content.   Assessment and Plan:  Pregnancy: G4P1021 at [redacted]w[redacted]d 1. Supervision of other normal pregnancy, antepartum (Primary)   2. Alpha thalassemia silent carrier   3. Placenta previa antepartum On 19 week US , pelvic rest was advised and f/u after 6 weeks was scheduled  Preterm labor symptoms and general obstetric precautions including but not limited to vaginal bleeding, contractions, leaking of fluid and fetal movement were reviewed in detail with the patient. Please refer to After Visit  Summary for other counseling recommendations.   Return in about 4 weeks (around 08/14/2023).  Future Appointments  Date Time Provider Department Center  08/14/2023 11:15 AM Tresia Fruit, [place] [facility] None  08/18/2023  1:00 PM [facility] PROVIDER 1 [facility]  08/18/2023  1:30 PM [facility] US4 [facility]  08/18/2023  2:30 PM [facility] GENETIC COUNSELING RM [facility]    Onnie Bilis, MD

## 2023-08-14 ENCOUNTER — Ambulatory Visit: Admitting: Obstetrics & Gynecology

## 2023-08-14 VITALS — BP 107/70 | HR 86 | Wt 176.0 lb

## 2023-08-14 DIAGNOSIS — O4400 Placenta previa specified as without hemorrhage, unspecified trimester: Secondary | ICD-10-CM

## 2023-08-14 DIAGNOSIS — Z3480 Encounter for supervision of other normal pregnancy, unspecified trimester: Secondary | ICD-10-CM | POA: Diagnosis not present

## 2023-08-14 DIAGNOSIS — D563 Thalassemia minor: Secondary | ICD-10-CM | POA: Diagnosis not present

## 2023-08-14 NOTE — Progress Notes (Signed)
   PRENATAL VISIT NOTE  Subjective:  Erika Bailey is a 23 y.o. [phone] at [redacted]w[redacted]d being seen today for ongoing prenatal care.  She is currently monitored for the following issues for this high-risk pregnancy and has History of ELISA positive for HSV; Supervision of other normal pregnancy, antepartum; Alpha thalassemia silent carrier; and Placenta previa antepartum on their problem list.  Patient reports no complaints.  Contractions: Not present. Vag. Bleeding: None.  Movement: Present. Denies leaking of fluid.   The following portions of the patient's history were reviewed and updated as appropriate: allergies, current medications, past family history, past medical history, past social history, past surgical history and problem list.   Objective:    Vitals:   08/14/23 1129  BP: 107/70  Pulse: 86  Weight: 176 lb (79.8 kg)    Fetal Status:      Movement: Present    General: Alert, oriented and cooperative. Patient is in no acute distress.  Skin: Skin is warm and dry. No rash noted.   Cardiovascular: Normal heart rate noted  Respiratory: Normal respiratory effort, no problems with respiration noted  Abdomen: Soft, gravid, appropriate for gestational age.  Pain/Pressure: Absent     Pelvic: Cervical exam deferred        Extremities: Normal range of motion.     Mental Status: Normal mood and affect. Normal behavior. Normal judgment and thought content.   Assessment and Plan:  Pregnancy: G4P1021 at [redacted]w[redacted]d 1. Supervision of other normal pregnancy, antepartum (Primary) F/u US  soon  2. Placenta previa antepartum Pelvic rest advised  3. Alpha thalassemia silent carrier   Preterm labor symptoms and general obstetric precautions including but not limited to vaginal bleeding, contractions, leaking of fluid and fetal movement were reviewed in detail with the patient. Please refer to After Visit Summary for other counseling recommendations.   Return in about 4 weeks (around  09/11/2023).  Future Appointments  Date Time Provider Department Center  08/18/2023  1:00 PM [facility] PROVIDER 1 [facility]  08/18/2023  1:30 PM [facility] US4 [facility]  08/18/2023  2:30 PM [facility] GENETIC COUNSELING RM [facility]    Onnie Bilis, MD

## 2023-08-18 ENCOUNTER — Ambulatory Visit

## 2023-08-18 ENCOUNTER — Ambulatory Visit (HOSPITAL_BASED_OUTPATIENT_CLINIC_OR_DEPARTMENT_OTHER)

## 2023-08-18 ENCOUNTER — Ambulatory Visit: Attending: Obstetrics | Admitting: Obstetrics and Gynecology

## 2023-08-18 ENCOUNTER — Other Ambulatory Visit: Payer: Self-pay | Admitting: *Deleted

## 2023-08-18 VITALS — BP 131/59 | HR 78

## 2023-08-18 DIAGNOSIS — O352XX0 Maternal care for (suspected) hereditary disease in fetus, not applicable or unspecified: Secondary | ICD-10-CM | POA: Diagnosis not present

## 2023-08-18 DIAGNOSIS — Z362 Encounter for other antenatal screening follow-up: Secondary | ICD-10-CM | POA: Insufficient documentation

## 2023-08-18 DIAGNOSIS — Z148 Genetic carrier of other disease: Secondary | ICD-10-CM | POA: Diagnosis not present

## 2023-08-18 DIAGNOSIS — O4402 Placenta previa specified as without hemorrhage, second trimester: Secondary | ICD-10-CM

## 2023-08-18 DIAGNOSIS — Z3A25 25 weeks gestation of pregnancy: Secondary | ICD-10-CM | POA: Diagnosis not present

## 2023-08-18 DIAGNOSIS — O99012 Anemia complicating pregnancy, second trimester: Secondary | ICD-10-CM

## 2023-08-18 DIAGNOSIS — D563 Thalassemia minor: Secondary | ICD-10-CM

## 2023-08-18 DIAGNOSIS — O35EXX0 Maternal care for other (suspected) fetal abnormality and damage, fetal genitourinary anomalies, not applicable or unspecified: Secondary | ICD-10-CM | POA: Diagnosis not present

## 2023-08-18 DIAGNOSIS — O4440 Low lying placenta NOS or without hemorrhage, unspecified trimester: Secondary | ICD-10-CM

## 2023-08-18 NOTE — Progress Notes (Signed)
 After review, MFM consult with provider is not indicated for today  Erika Clever, MD 08/18/2023 2:55 PM  [facility]

## 2023-08-18 NOTE — Progress Notes (Signed)
 [facility] at [facility] [address], [address] Phone:  [phone]   Fax:  [phone]      In-Person Genetic Counseling Clinic Note:   I spoke with 23 y.o. Erika Bailey today to discuss her carrier screening results. She was referred by Tresia Fruit, MD.    Pregnancy History:    [id number]. EGA: [redacted]w[redacted]d by LMP. EDD: 11/30/2023. Erika Bailey reported two early miscarriages. No known underlying health or genetic causes. Denies major personal health concerns. Denies bleeding, infections, and fevers in this pregnancy. Denies using tobacco, alcohol, or street drugs in this pregnancy.   Family History:    A three-generation pedigree was created and scanned into Epic under the Media tab.  Patient reports that she, her brother, and her son were born with bilateral postaxial polydactyly. We reviewed that isolated postaxial polydactyly is an autosomal dominant condition with reduced penetrance. It is more common in Black populations. Recurrence risk is 50%.  Erika Bailey reports FOB's daughter has sickle cell trait. She reports the daughter's mother has sickle cell disease, and it would be expected that the daughter's carrier status was inherited from her mother. FOB's carrier status is unknown. Erika Bailey was not found to be a carrier for sickle cell disease. Therefore, there is no increased risk to the fetus. However, without review of reports it is possible that another hemoglobinopathy may be present and not included on the currently available carrier screening. Patient is encouraged to reach out with clarification or test reports if they become available.   Maternal ethnicity reported as Black and paternal ethnicity reported as Black. Denies Ashkenazi Jewish ancestry.  Family history not remarkable for consanguinity, individuals with birth defects, intellectual disability, autism spectrum disorder, multiple spontaneous abortions, still births, or  unexplained neonatal death.   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 She screened negative for the other three conditions (CF, SMA, and beta-hemoglobinopathies) which significantly reduces but does not eliminate the chance of being a carrier for those conditions. Please see report for details.  We reviewed the genetics of alpha thalassemia, autosomal recessive mode of inheritance, and clinical features of these conditions. We reviewed that Felissa will either pass down two copies of the alpha globin gene (??) OR one copy of the alpha globin gene (-?) in each pregnancy. Therefore, this pregnancy is not at increased risk for hemoglobin Bart's due to four deletions of the alpha globin genes (--/--) regardless of her reproductive partner's carrier status. The pregnancy will be at increased risk (25%) for hemoglobin H disease if her partner is an alpha thalassemia carrier in the cis configuration (--/??). We reviewed that this is more common in [place] Asian populations and less common in those with Black ancestry.  Given these results, we discussed and offered carrier screening for Burnadette's reproductive partner Erika Bailey. We reviewed the benefits and limitations of carrier screening and that it can detect most but not all carriers.   A [employer] carrier screening saliva kit was provided to the patient. We reviewed that if both were found to be carriers, prenatal diagnosis through amniocentesis would be available. We reviewed the technical aspects, benefits, risks, and limitations of amniocentesis including the 1 in 500 risk for preterm delivery. Alternatively, testing can be completed postnatally. Of note, alpha thalassemia is not included on [place] 's Newborn Screening (NBS) program.  Previous Testing Completed:  Low risk NIPS: Lindsey previously completed noninvasive  prenatal screening (NIPS) in this pregnancy. The result is low risk, consistent with a female fetus. This screening significantly reduces but does not eliminate the chance that the current pregnancy has Down syndrome (trisomy 52), trisomy 66, trisomy 34, common sex chromosome conditions, and 22q11.2 microdeletion syndrome. Please see report for details. There are many genetic conditions that cannot be detected by NIPS.   Negative ms-AFP screening: Janaa previously completed a maternal serum AFP screen in this pregnancy. The result is screen negative. Please see report for details. A negative result reduces the risk that the current pregnancy has an open neural tube defect. Closed neural tube defects and some open defects may not be detected by this screen.   Plan of Care:   Horizon carrier screening saliva kit was provided to the patient. Carrier screening for alpha thalassemia will be ordered for FOB. Patient declined amniocentesis. Routine prenatal care.   Informed consent was obtained. All questions were answered.   45 minutes were spent on the date of the encounter in service to the patient including preparation, face-to-face consultation, discussion of test reports and available next steps, pedigree construction, genetic risk assessment, documentation, and care coordination.    Thank you for sharing in the care of [facility] with us .  Please do not hesitate to contact us  at [phone] if you have any questions.   Georgean Kindle, MS, [facility] Certified Genetic Counselor   Genetic counseling student involved in appointment: No.

## 2023-09-11 ENCOUNTER — Other Ambulatory Visit

## 2023-09-11 ENCOUNTER — Ambulatory Visit: Payer: Self-pay | Admitting: Obstetrics and Gynecology

## 2023-09-11 VITALS — BP 102/68 | HR 102

## 2023-09-11 DIAGNOSIS — Z3480 Encounter for supervision of other normal pregnancy, unspecified trimester: Secondary | ICD-10-CM

## 2023-09-11 DIAGNOSIS — Z1332 Encounter for screening for maternal depression: Secondary | ICD-10-CM

## 2023-09-11 DIAGNOSIS — Z3A28 28 weeks gestation of pregnancy: Secondary | ICD-10-CM

## 2023-09-11 DIAGNOSIS — O4400 Placenta previa specified as without hemorrhage, unspecified trimester: Secondary | ICD-10-CM

## 2023-09-11 DIAGNOSIS — Z8619 Personal history of other infectious and parasitic diseases: Secondary | ICD-10-CM

## 2023-09-11 DIAGNOSIS — O4403 Placenta previa specified as without hemorrhage, third trimester: Secondary | ICD-10-CM

## 2023-09-11 DIAGNOSIS — D563 Thalassemia minor: Secondary | ICD-10-CM

## 2023-09-11 DIAGNOSIS — Z3483 Encounter for supervision of other normal pregnancy, third trimester: Secondary | ICD-10-CM

## 2023-09-11 NOTE — Progress Notes (Signed)
   PRENATAL VISIT NOTE  Subjective:  Erika Bailey is a 23 y.o. [phone] at [redacted]w[redacted]d being seen today for ongoing prenatal care.  She is currently monitored for the following issues for this low-risk pregnancy and has History of ELISA positive for HSV; Supervision of other normal pregnancy, antepartum; Alpha thalassemia silent carrier; and Placenta previa antepartum on their problem list.  Patient doing well with no acute concerns today. She reports no complaints.  Contractions: Not present. Vag. Bleeding: None.  Movement: Present. Denies leaking of fluid.   The following portions of the patient's history were reviewed and updated as appropriate: allergies, current medications, past family history, past medical history, past social history, past surgical history and problem list. Problem list updated.  Objective:   Vitals:   09/11/23 0920  BP: 102/68  Pulse: (!) 102    Fetal Status: Fetal Heart Rate (bpm): 154 Fundal Height: 28 cm Movement: Present     General:  Alert, oriented and cooperative. Patient is in no acute distress.  Skin: Skin is warm and dry. No rash noted.   Cardiovascular: Normal heart rate noted  Respiratory: Normal respiratory effort, no problems with respiration noted  Abdomen: Soft, gravid, appropriate for gestational age.  Pain/Pressure: Absent     Pelvic: Cervical exam deferred        Extremities: Normal range of motion.  Edema: None  Mental Status:  Normal mood and affect. Normal behavior. Normal judgment and thought content.   Assessment and Plan:  Pregnancy: G4P1021 at [redacted]w[redacted]d  1. Supervision of other normal pregnancy, antepartum (Primary) Continue routine prenatal care  - Glucose Tolerance, 2 Hours w/1 Hour - RPR - CBC - HIV antibody (with reflex)  2. [redacted] weeks gestation of pregnancy  - Glucose Tolerance, 2 Hours w/1 Hour - RPR - CBC - HIV antibody (with reflex)  3. History of ELISA positive for HSV Prophylaxis at 36 weeks  4. Placenta previa  antepartum Last scan probably more like low lying placenta, possible vessels near cervix, pt has follow up scan on 7/11  5. Alpha thalassemia silent carrier   Preterm labor symptoms and general obstetric precautions including but not limited to vaginal bleeding, contractions, leaking of fluid and fetal movement were reviewed in detail with the patient.  Please refer to After Visit Summary for other counseling recommendations.   Return in about 2 weeks (around 09/25/2023) for in person.   Jerilynn Buddle, MD Faculty Attending Center for [facility]

## 2023-09-11 NOTE — Progress Notes (Signed)
ROB/GTT.  Declined TDAP.

## 2023-09-12 LAB — CBC
Hematocrit: 33.3 % — ABNORMAL LOW (ref 34.0–46.6)
Hemoglobin: 10.3 g/dL — ABNORMAL LOW (ref 11.1–15.9)
MCH: 26.2 pg — ABNORMAL LOW (ref 26.6–33.0)
MCHC: 30.9 g/dL — ABNORMAL LOW (ref 31.5–35.7)
MCV: 85 fL (ref 79–97)
Platelets: 265 x10E3/uL (ref 150–450)
RBC: 3.93 x10E6/uL (ref 3.77–5.28)
RDW: 14.9 % (ref 11.7–15.4)
WBC: 9.2 x10E3/uL (ref 3.4–10.8)

## 2023-09-12 LAB — GLUCOSE TOLERANCE, 2 HOURS W/ 1HR
Glucose, 1 hour: 97 mg/dL (ref 70–179)
Glucose, 2 hour: 88 mg/dL (ref 70–152)
Glucose, Fasting: 72 mg/dL (ref 70–91)

## 2023-09-12 LAB — HIV ANTIBODY (ROUTINE TESTING W REFLEX): HIV Screen 4th Generation wRfx: NONREACTIVE

## 2023-09-12 LAB — RPR: RPR Ser Ql: NONREACTIVE

## 2023-09-15 ENCOUNTER — Ambulatory Visit: Payer: Self-pay | Admitting: Obstetrics and Gynecology

## 2023-09-17 ENCOUNTER — Ambulatory Visit

## 2023-09-19 ENCOUNTER — Ambulatory Visit (HOSPITAL_BASED_OUTPATIENT_CLINIC_OR_DEPARTMENT_OTHER)

## 2023-09-19 ENCOUNTER — Ambulatory Visit: Attending: Obstetrics and Gynecology | Admitting: Obstetrics

## 2023-09-19 VITALS — BP 104/55 | HR 84

## 2023-09-19 DIAGNOSIS — O352XX0 Maternal care for (suspected) hereditary disease in fetus, not applicable or unspecified: Secondary | ICD-10-CM | POA: Diagnosis not present

## 2023-09-19 DIAGNOSIS — Z3A29 29 weeks gestation of pregnancy: Secondary | ICD-10-CM | POA: Diagnosis not present

## 2023-09-19 DIAGNOSIS — Z362 Encounter for other antenatal screening follow-up: Secondary | ICD-10-CM | POA: Diagnosis present

## 2023-09-19 DIAGNOSIS — O4440 Low lying placenta NOS or without hemorrhage, unspecified trimester: Secondary | ICD-10-CM

## 2023-09-19 DIAGNOSIS — Z148 Genetic carrier of other disease: Secondary | ICD-10-CM

## 2023-09-19 DIAGNOSIS — O4403 Placenta previa specified as without hemorrhage, third trimester: Secondary | ICD-10-CM | POA: Diagnosis not present

## 2023-09-19 DIAGNOSIS — D563 Thalassemia minor: Secondary | ICD-10-CM | POA: Diagnosis not present

## 2023-09-19 DIAGNOSIS — O35BXX0 Maternal care for other (suspected) fetal abnormality and damage, fetal cardiac anomalies, not applicable or unspecified: Secondary | ICD-10-CM | POA: Diagnosis not present

## 2023-09-19 DIAGNOSIS — O4443 Low lying placenta NOS or without hemorrhage, third trimester: Secondary | ICD-10-CM | POA: Diagnosis not present

## 2023-09-19 DIAGNOSIS — O4400 Placenta previa specified as without hemorrhage, unspecified trimester: Secondary | ICD-10-CM

## 2023-09-19 DIAGNOSIS — O358XX0 Maternal care for other (suspected) fetal abnormality and damage, not applicable or unspecified: Secondary | ICD-10-CM

## 2023-09-19 NOTE — Progress Notes (Signed)
 MFM Consult Note  Erika Bailey is currently at 29 weeks and 5 days.  She has been followed due to a placenta previa that was noted on her prior exams.   She denies any problems since her last visit.  The patient has screened positive as a silent carrier for alpha thalassemia.  Her partner has declined to be screened to determine if he is also a carrier for this condition.  On today's exam, the overall EFW of 3 pounds 2 ounces measures at the 30th percentile for her gestational age.    There was normal amniotic fluid noted with a total AFI of 15.28 cm.  The previously noted placenta previa/low-lying placenta has resolved.  The patient was advised that she may continue routine prenatal care and attempt a vaginal delivery at term should she desire.  This finding was confirmed via transvaginal ultrasound today.  As the placenta previa has resolved, no further exams were scheduled in our office.   The patient stated that all of her questions were answered today.  A total of 20 minutes was spent counseling and coordinating the care for this patient.  Greater than 50% of the time was spent in direct face-to-face contact.

## 2023-09-26 ENCOUNTER — Other Ambulatory Visit (HOSPITAL_COMMUNITY)
Admission: RE | Admit: 2023-09-26 | Discharge: 2023-09-26 | Disposition: A | Discharge status: Home / Self Care | Source: Ambulatory Visit | Attending: Obstetrics and Gynecology | Admitting: Obstetrics and Gynecology

## 2023-09-26 ENCOUNTER — Ambulatory Visit (INDEPENDENT_AMBULATORY_CARE_PROVIDER_SITE_OTHER): Admitting: Obstetrics and Gynecology

## 2023-09-26 VITALS — BP 107/71 | HR 105 | Wt 184.0 lb

## 2023-09-26 DIAGNOSIS — N898 Other specified noninflammatory disorders of vagina: Secondary | ICD-10-CM | POA: Diagnosis present

## 2023-09-26 DIAGNOSIS — O26893 Other specified pregnancy related conditions, third trimester: Secondary | ICD-10-CM | POA: Diagnosis present

## 2023-09-26 DIAGNOSIS — Z3A30 30 weeks gestation of pregnancy: Secondary | ICD-10-CM

## 2023-09-26 DIAGNOSIS — Z3480 Encounter for supervision of other normal pregnancy, unspecified trimester: Secondary | ICD-10-CM | POA: Diagnosis not present

## 2023-09-26 DIAGNOSIS — O4400 Placenta previa specified as without hemorrhage, unspecified trimester: Secondary | ICD-10-CM

## 2023-09-26 DIAGNOSIS — Z8619 Personal history of other infectious and parasitic diseases: Secondary | ICD-10-CM | POA: Diagnosis not present

## 2023-09-26 DIAGNOSIS — D563 Thalassemia minor: Secondary | ICD-10-CM

## 2023-09-26 NOTE — Progress Notes (Signed)
 ROB in office, reports fetal movement, denies pain.

## 2023-09-26 NOTE — Progress Notes (Signed)
   PRENATAL VISIT NOTE  Subjective:  Erika Bailey is a 23 y.o. [phone] at [redacted]w[redacted]d being seen today for ongoing prenatal care.  She is currently monitored for the following issues for this low-risk pregnancy and has History of ELISA positive for HSV; Supervision of other normal pregnancy, antepartum; Alpha thalassemia silent carrier; and Placenta previa antepartum on their problem list.  Patient doing well with no acute concerns today. She reports vaginal irritation and whitish vaginal discharge.  Contractions: Not present. Vag. Bleeding: None.  Movement: Present. Denies leaking of fluid.   The following portions of the patient's history were reviewed and updated as appropriate: allergies, current medications, past family history, past medical history, past social history, past surgical history and problem list. Problem list updated.  Objective:   Vitals:   09/26/23 1104  BP: 107/71  Pulse: (!) 105  Weight: 184 lb (83.5 kg)    Fetal Status: Fetal Heart Rate (bpm): 146 Fundal Height: 31 cm Movement: Present     General:  Alert, oriented and cooperative. Patient is in no acute distress.  Skin: Skin is warm and dry. No rash noted.   Cardiovascular: Normal heart rate noted  Respiratory: Normal respiratory effort, no problems with respiration noted  Abdomen: Soft, gravid, appropriate for gestational age.  Pain/Pressure: Absent     Pelvic: Cervical exam deferred        Extremities: Normal range of motion.  Edema: None  Mental Status:  Normal mood and affect. Normal behavior. Normal judgment and thought content.   Assessment and Plan:  Pregnancy: G4P1021 at [redacted]w[redacted]d  1. [redacted] weeks gestation of pregnancy (Primary)   2. Supervision of other normal pregnancy, antepartum Continue routine prenatal care  3. Placenta previa antepartum Per ultrasound on 7/11 it has resolved  4. History of ELISA positive for HSV Prophylaxis at 36 weeks  5. Alpha thalassemia silent carrier   6. Vaginal  discharge during pregnancy in third trimester Self swab taken for itchy vaginal discharge - Cervicovaginal ancillary only  Preterm labor symptoms and general obstetric precautions including but not limited to vaginal bleeding, contractions, leaking of fluid and fetal movement were reviewed in detail with the patient.  Please refer to After Visit Summary for other counseling recommendations.   Return in about 2 weeks (around 10/10/2023) for [facility], in person.   Jerilynn Buddle, MD Faculty Attending Center for [facility]

## 2023-09-29 LAB — CERVICOVAGINAL ANCILLARY ONLY
Bacterial Vaginitis (gardnerella): NEGATIVE
Candida Glabrata: NEGATIVE
Candida Vaginitis: POSITIVE — AB
Comment: NEGATIVE
Comment: NEGATIVE
Comment: NEGATIVE
Comment: NEGATIVE
Trichomonas: NEGATIVE

## 2023-09-30 ENCOUNTER — Ambulatory Visit: Payer: Self-pay | Admitting: Obstetrics and Gynecology

## 2023-09-30 ENCOUNTER — Other Ambulatory Visit: Payer: Self-pay

## 2023-09-30 DIAGNOSIS — B379 Candidiasis, unspecified: Secondary | ICD-10-CM

## 2023-09-30 MED ORDER — TERCONAZOLE 0.8 % VA CREA: 1.0000 | TOPICAL_CREAM | Freq: Every day | VAGINAL | 0 refills | Status: DC

## 2023-10-10 ENCOUNTER — Encounter: Admitting: Obstetrics and Gynecology

## 2023-10-18 ENCOUNTER — Inpatient Hospital Stay (HOSPITAL_COMMUNITY)
Admission: AD | Admit: 2023-10-18 | Discharge: 2023-10-18 | Disposition: A | Discharge status: Home / Self Care | Attending: Family Medicine | Admitting: Family Medicine

## 2023-10-18 ENCOUNTER — Encounter (HOSPITAL_COMMUNITY): Payer: Self-pay | Admitting: Family Medicine

## 2023-10-18 ENCOUNTER — Other Ambulatory Visit: Payer: Self-pay

## 2023-10-18 DIAGNOSIS — Z3A33 33 weeks gestation of pregnancy: Secondary | ICD-10-CM

## 2023-10-18 DIAGNOSIS — O26893 Other specified pregnancy related conditions, third trimester: Secondary | ICD-10-CM

## 2023-10-18 DIAGNOSIS — R1011 Right upper quadrant pain: Secondary | ICD-10-CM

## 2023-10-18 DIAGNOSIS — R0781 Pleurodynia: Secondary | ICD-10-CM | POA: Diagnosis not present

## 2023-10-18 DIAGNOSIS — Z3689 Encounter for other specified antenatal screening: Secondary | ICD-10-CM

## 2023-10-18 LAB — URINALYSIS, ROUTINE W REFLEX MICROSCOPIC
Bilirubin Urine: NEGATIVE
Glucose, UA: NEGATIVE mg/dL
Hgb urine dipstick: NEGATIVE
Ketones, ur: NEGATIVE mg/dL
Leukocytes,Ua: NEGATIVE
Nitrite: NEGATIVE
Protein, ur: NEGATIVE mg/dL
Specific Gravity, Urine: 1.002 — ABNORMAL LOW (ref 1.005–1.030)
pH: 7 (ref 5.0–8.0)

## 2023-10-18 NOTE — MAU Provider Note (Signed)
 MAU Provider Note  Chief Complaint: Abdominal Pain and Back Pain  SUBJECTIVE HPI: Erika Bailey is a 23 y.o. [id number] at [redacted]w[redacted]d by LMP who presents to maternity admissions reporting RUQ pain. Pregnancy c/b none. Receives [facility] with [facility].  Patient notes having pain in her right upper quadrant/under her ribs throughout the day today.  She notes that if she laid down it went away and if she was up moving around it came back.  Was constant in nature and sharp.  Worst was 7/10.  Wondering if it was her baby kicking her.  Came in to the MAU and pain stopped.  She notes positive fetal movement.  Denies nausea/vomiting, fever/chills, contractions, vaginal bleeding, loss of fluid, urinary symptoms, change in discharge.  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   Past Surgical History:  Procedure Laterality Date   DILATION AND EVACUATION N/A 01/21/2018   Procedure: DILATATION AND EVACUATION;  Surgeon: Eveline Lynwood MATSU, MD;  Location: [facility];  Service: Gynecology;  Laterality: N/A;   NO PAST SURGERIES     Social History   Socioeconomic History   Marital status: Single    Spouse name: Not on file   Number of children: Not on file   Years of education: Not on file   Highest education level: Not on file  Occupational History   Occupation: [employer]  Tobacco Use   Smoking status: Former    Current packs/day: 0.00    Types: Cigarettes    Quit date: 01/24/2019    Years since quitting: 4.7   Smokeless tobacco: Never  Vaping Use   Vaping status: Never Used  Substance and Sexual Activity   Alcohol use: Not Currently    Alcohol/week: 1.0 standard drink of alcohol    Types: 1 Glasses of wine per week   Drug use: Never   Sexual activity: Yes    Birth control/protection: None  Other Topics Concern   Not on file  Social History Narrative   Not on file   Social Drivers of Health   Financial  Resource Strain: Not on File (10/09/2021)   Received from [employer]    Financial Resource Strain: 0  Food Insecurity: Not on File (12/05/2022)   Received from [facility] Insecurity    Food: 0  Transportation Needs: Not on File (10/09/2021)   Received from [employer] Needs    Transportation: 0  Physical Activity: Not on File (10/09/2021)   Received from [facility]   Physical Activity    Physical Activity: 0  Stress: Not on File (10/09/2021)   Received from [facility]   Stress    Stress: 0  Social Connections: Not on File (11/18/2022)   Received from [facility] [facility]   Social Connections    Connectedness: 0  Intimate Partner Violence: Not on file   No current facility-administered medications on file prior to encounter.   Current Outpatient Medications on File Prior to Encounter  Medication Sig Dispense Refill   Blood Pressure Monitoring (BLOOD PRESSURE KIT) DEVI 1 Device by Does not apply route once a week. (Patient not taking: Reported on 09/26/2023) 1 each 0   Prenat-FeAsp-Meth-FA-DHA w/o A (PRENATE PIXIE ) 10-0.6-0.4-200 MG CAPS Take 1 capsule by mouth daily. (Patient not taking: Reported on 09/26/2023) 30 capsule 11   terconazole  (TERAZOL 3 ) 0.8 % vaginal cream Place 1 applicator vaginally at bedtime. Apply nightly  for three nights. 20 g 0   [DISCONTINUED] medroxyPROGESTERone  (DEPO-PROVERA ) 150 MG/ML injection Inject 1 mL (150 mg total) into the muscle every 3 (three) months. 1 mL 3   Allergies  Allergen Reactions   Peanuts [Peanut Oil] Itching and Swelling    ROS:  Pertinent positives/negatives listed above.  I have reviewed patient's Past Medical Hx, Surgical Hx, Family Hx, Social Hx, medications and allergies.   Physical Exam  Patient Vitals for the past 24 hrs:  BP Temp Temp src Pulse Resp SpO2  10/18/23 2036 112/61 98.6 F (37 C) Oral 99 18 100 %   Constitutional: Well-developed, well-nourished female in no acute distress  Cardiovascular: normal  rate Respiratory: normal effort GI: Abd soft, non-tender, gravid.  Baby vertex with but in a right upper quadrant sitting against ribs MS: Extremities nontender, no edema, normal ROM Neurologic: Alert and oriented x 4  GU: Neg CVAT  FHT:  Baseline 140, moderate variability, accelerations present, no decelerations Contractions: q 5 mins  LAB RESULTS Results for orders placed or performed during the hospital encounter of 10/18/23 (from the past 24 hours)  Urinalysis, Routine w reflex microscopic -Urine, Clean Catch     Status: Abnormal   Collection Time: 10/18/23  8:39 PM  Result Value Ref Range   Color, Urine STRAW (A) YELLOW   APPearance CLEAR CLEAR   Specific Gravity, Urine 1.002 (L) 1.005 - 1.030   pH 7.0 5.0 - 8.0   Glucose, UA NEGATIVE NEGATIVE mg/dL   Hgb urine dipstick NEGATIVE NEGATIVE   Bilirubin Urine NEGATIVE NEGATIVE   Ketones, ur NEGATIVE NEGATIVE mg/dL   Protein, ur NEGATIVE NEGATIVE mg/dL   Nitrite NEGATIVE NEGATIVE   Leukocytes,Ua NEGATIVE NEGATIVE   RBC / HPF 0-5 0 - 5 RBC/hpf   WBC, UA 0-5 0 - 5 WBC/hpf   Bacteria, UA MANY (A) NONE SEEN   Squamous Epithelial / HPF 0-5 0 - 5 /HPF    A/Positive/-- (03/12 1101)  IMAGING US  MFM OB FOLLOW UP Result Date: 09/19/2023 ----------------------------------------------------------------------  OBSTETRICS REPORT                       (Signed Final 09/19/2023 04:35 pm) ---------------------------------------------------------------------- Patient Info  ID #:       [id number]                          D.O.B.:  07/03/2000 (23 yrs)(F)  Name:       Erika Bailey               Visit Date: 09/19/2023 03:12 pm ---------------------------------------------------------------------- Performed By  Attending:        Steffan Keys MD         Ref. Address:     [address]                                                             [address]  [place], [place]                                                              [address]  Performed By:     Jonette Nap        [facility]                                  [facility]  Referred By:      [facility] ---------------------------------------------------------------------- Orders  #  Description                           Code        Ordered By  1  US  MFM OB FOLLOW UP                   76816.01    RAVI SHANKAR  2  US  MFM OB TRANSVAGINAL                23182.7     RAVI [facility] ----------------------------------------------------------------------  #  Order #                     Accession #                Episode #  1  [id number]                   [id number]                 [id number]  2  [id number]                   [id number]                 [id number] ---------------------------------------------------------------------- Indications  Placenta previa specified as without           O44.03  hemorrhage, third trimester  Echogenic intracardiac focus of the heart      O35.8XX0  (EIF)  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 Cord Insertion:      Previously seen  Amniotic Fluid  AFI FV:      Within normal limits  AFI Sum(cm)     %Tile       Largest Pocket(cm)  15.28           54          4.94  RUQ(cm)       RLQ(cm)       LUQ(cm)         LLQ(cm)  4.94          3.79          3.79           2.76 ---------------------------------------------------------------------- Biometry  BPD:      75.9  mm     G. Age:  30w 3d         61  %    CI:        77.51   %    70 - 86                                                          FL/HC:      19.6   %    19.2 - 21.4  HC:      272.9  mm     G. Age:  29w 6d         18  %    HC/AC:      1.05        0.99 - 1.21  AC:      259.1  mm     G. Age:  30w 0d         55  %    FL/BPD:     70.5   %    71 - 87  FL:       53.5  mm     G. Age:  28w 3d          8  %    FL/AC:      20.6   %    20 - 24  LV:        4.2  mm  Est. FW:    1408  gm      3 lb 2 oz     30  % ---------------------------------------------------------------------- OB History  Gravidity:    4         Term:   1        Prem:   0        SAB:   2  TOP:          0       Ectopic:  0  Living: 1 ---------------------------------------------------------------------- Gestational Age  LMP:           29w 5d        Date:  02/23/23                 EDD:   11/30/23  U/S Today:     29w 5d                                        EDD:   11/30/23  Best:          29w 5d     Det. By:  LMP  (02/23/23)          EDD:   11/30/23 ---------------------------------------------------------------------- Anatomy  Cranium:               Previously seen        Aortic Arch:            Previously seen  Cavum:                 Previously seen        Ductal Arch:            Previously seen  Ventricles:            Appears normal         Diaphragm:              Appears normal  Choroid Plexus:        Previously seen        Stomach:                Appears normal, left                                                                        sided  Cerebellum:            Previously seen        Abdomen:                Previously seen  Posterior Fossa:       Previously seen        Abdominal Wall:         Previously seen  Face:                  Orbits and profile     Cord Vessels:           Previously seen                          previously seen  Lips:                  Previously seen        Kidneys:                Appear normal  Thoracic:              Previously seen        Bladder:                Appears normal  Heart:  Appears normal         Spine:                  Previously seen                         (4CH, axis, and                         situs)  RVOT:                  Previously seen        Upper Extremities:      Previously seen  LVOT:                  Previously seen        Lower Extremities:      Previously seen  Other:  Female gender previously seen. Fetal anatomic survey complete on          prior scans. ---------------------------------------------------------------------- Comments  Erika Bailey is currently at 29 weeks and 5 days.  She  has been followed due to a placenta previa that was noted on  her prior exams.   She denies any problems since her last  visit.  The patient has screened positive as a silent carrier for alpha  thalassemia.  Her partner has declined to be screened to  determine if he is also a carrier for this condition.  On today's exam, the overall EFW of 3 pounds 2 ounces  measures at the 30th percentile for her gestational age.  There was normal amniotic fluid noted with a total AFI of  15.28 cm.  The previously noted placenta previa/low-lying placenta has  resolved.  The patient was advised that she may continue  routine prenatal care and attempt a vaginal delivery at term  should she desire.  This finding was confirmed via  transvaginal ultrasound today.  As the placenta previa has resolved, no further exams were  scheduled in our office.  The patient stated that all of her questions were answered  today.  A total of 20 minutes was spent counseling and coordinating  the care for this patient.  Greater than 50% of the time was  spent in direct face-to-face contact. ----------------------------------------------------------------------                   Steffan Keys, MD  Electronically Signed Final Report   09/19/2023 04:35 pm ----------------------------------------------------------------------   US  MFM OB Transvaginal Result Date: 09/19/2023 ----------------------------------------------------------------------  OBSTETRICS REPORT                       (Signed Final 09/19/2023 04:35 pm) ---------------------------------------------------------------------- Patient Info  ID #:       [id number]                          D.O.B.:  [birth year] (23 yrs)(F)  Name:       Erika Bailey               Visit Date: 09/19/2023 03:12 pm ---------------------------------------------------------------------- Performed By  Attending:        Steffan Keys MD         Ref. Address:     [address]  [address]                                                             [place], [place]                                                             [address]  Performed By:     Jonette Nap        [facility]                                  [facility]  Referred By:      [facility] ---------------------------------------------------------------------- Orders  #  Description                           Code        Ordered By  1  US  MFM OB FOLLOW UP                   76816.01    RAVI SHANKAR  2  US  MFM OB TRANSVAGINAL                [id number]     RAVI [facility] ----------------------------------------------------------------------  #  Order #                     Accession #                Episode #  1  [id number]                   [id number]                 [id number]  2  [id number]                   [id number]                 [id number] ---------------------------------------------------------------------- Indications  Placenta previa specified as without            O44.03  hemorrhage, third trimester  Echogenic intracardiac focus  of the heart      O35.8XX0  (EIF)  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 Cord Insertion:      Previously seen  Amniotic Fluid  AFI FV:      Within normal limits  AFI Sum(cm)     %Tile       Largest Pocket(cm)  15.28           54          4.94  RUQ(cm)       RLQ(cm)       LUQ(cm)        LLQ(cm)  4.94          3.79          3.79           2.76 ---------------------------------------------------------------------- Biometry  BPD:      75.9  mm     G. Age:  30w 3d         61  %    CI:        77.51   %    70 - 86                                                          FL/HC:      19.6   %    19.2 - 21.4  HC:      272.9  mm     G. Age:  29w 6d         18  %    HC/AC:      1.05        0.99 - 1.21  AC:      259.1  mm     G. Age:  30w 0d         55  %    FL/BPD:     70.5   %    71 - 87  FL:       53.5  mm     G. Age:  28w 3d          8  %    FL/AC:      20.6   %    20 - 24  LV:        4.2  mm  Est. FW:    1408  gm      3 lb 2 oz     30  % ---------------------------------------------------------------------- OB History  Gravidity:    4  Term:   1        Prem:   0        SAB:   2  TOP:          0       Ectopic:  0        Living: 1 ---------------------------------------------------------------------- Gestational Age  LMP:           29w 5d        Date:  02/23/23                 EDD:   11/30/23  U/S Today:     29w 5d                                         EDD:   11/30/23  Best:          29w 5d     Det. By:  LMP  (02/23/23)          EDD:   11/30/23 ---------------------------------------------------------------------- Anatomy  Cranium:               Previously seen        Aortic Arch:            Previously seen  Cavum:                 Previously seen        Ductal Arch:            Previously seen  Ventricles:            Appears normal         Diaphragm:              Appears normal  Choroid Plexus:        Previously seen        Stomach:                Appears normal, left                                                                        sided  Cerebellum:            Previously seen        Abdomen:                Previously seen  Posterior Fossa:       Previously seen        Abdominal Wall:         Previously seen  Face:                  Orbits and profile     Cord Vessels:           Previously seen                         previously seen  Lips:                  Previously seen        Kidneys:  Appear normal  Thoracic:              Previously seen        Bladder:                Appears normal  Heart:                 Appears normal         Spine:                  Previously seen                         (4CH, axis, and                         situs)  RVOT:                  Previously seen        Upper Extremities:      Previously seen  LVOT:                  Previously seen        Lower Extremities:      Previously seen  Other:  Female gender previously seen. Fetal anatomic survey complete on          prior scans. ---------------------------------------------------------------------- Comments  Erika Bailey is currently at 29 weeks and 5 days.  She  has been followed due to a placenta previa that was noted on  her prior exams.   She denies any problems since her last  visit.  The patient has screened positive as a silent carrier for alpha  thalassemia.  Her partner has declined to be screened to  determine if he is also a  carrier for this condition.  On today's exam, the overall EFW of 3 pounds 2 ounces  measures at the 30th percentile for her gestational age.  There was normal amniotic fluid noted with a total AFI of  15.28 cm.  The previously noted placenta previa/low-lying placenta has  resolved.  The patient was advised that she may continue  routine prenatal care and attempt a vaginal delivery at term  should she desire.  This finding was confirmed via  transvaginal ultrasound today.  As the placenta previa has resolved, no further exams were  scheduled in our office.  The patient stated that all of her questions were answered  today.  A total of 20 minutes was spent counseling and coordinating  the care for this patient.  Greater than 50% of the time was  spent in direct face-to-face contact. ----------------------------------------------------------------------                   Steffan Keys, MD Electronically Signed Final Report   09/19/2023 04:35 pm ----------------------------------------------------------------------    MAU Management/MDM: Orders Placed This Encounter  Procedures   Urinalysis, Routine w reflex microscopic -Urine, Clean Catch   Discharge patient    No orders of the defined types were placed in this encounter.    Available prenatal records reviewed.  Patient presents with right upper quadrant pain at [redacted]w[redacted]d pregnant.  Pain stopped after arrival to MAU.  She notes no symptoms currently.  Based on baby's position in the uterus, I do suspect that kicking or stretching did cause the pain she was experiencing earlier today.  Discussed conservative measures to help baby reposition away from her ribs.  Reactive  NST.  She is having a regular contractions, however she is not feeling these and they do not palpate.  Do not suspect preterm labor, urinary etiology, epigastric etiology for her pain.  ASSESSMENT 1. Continuous RUQ abdominal pain   2. [redacted] weeks gestation of pregnancy   3. NST (non-stress test)  reactive     PLAN Discharge home with strict return precautions. Allergies as of 10/18/2023       Reactions   Peanuts [peanut Oil] Itching, Swelling        Medication List     STOP taking these medications    terconazole  0.8 % vaginal cream Commonly known as: TERAZOL 3        TAKE these medications    Blood Pressure Kit Devi 1 Device by Does not apply route once a week.   Prenate Pixie  10-0.6-0.4-200 MG Caps Take 1 capsule by mouth daily.         Almarie Moats, MD OB Fellow 10/18/2023  9:31 PM

## 2023-10-18 NOTE — MAU Note (Signed)
 Erika Bailey is a 23 y.o. at [redacted]w[redacted]d here in MAU reporting: cramping on right side of her abdomen that's coming and going and dull lower back pain. Denies VB or LOF. +FM   LMP: NA Onset of complaint: 1100 Pain score: 8 - abdomen; 5 - back  Vitals:   10/18/23 2036  BP: 112/61  Pulse: 99  Resp: 18  Temp: 98.6 F (37 C)  SpO2: 100%     FHT: 143  Lab orders placed from triage: UA

## 2023-12-01 ENCOUNTER — Encounter: Payer: Self-pay | Admitting: Obstetrics and Gynecology

## 2023-12-01 ENCOUNTER — Ambulatory Visit: Admitting: Obstetrics and Gynecology

## 2023-12-01 ENCOUNTER — Other Ambulatory Visit (HOSPITAL_COMMUNITY)
Admission: RE | Admit: 2023-12-01 | Discharge: 2023-12-01 | Disposition: A | Discharge status: Home / Self Care | Payer: Self-pay | Source: Ambulatory Visit | Attending: Obstetrics and Gynecology | Admitting: Obstetrics and Gynecology

## 2023-12-01 VITALS — BP 101/64 | HR 99 | Wt 188.0 lb

## 2023-12-01 DIAGNOSIS — Z3A40 40 weeks gestation of pregnancy: Secondary | ICD-10-CM

## 2023-12-01 DIAGNOSIS — O480 Post-term pregnancy: Secondary | ICD-10-CM

## 2023-12-01 DIAGNOSIS — Z3480 Encounter for supervision of other normal pregnancy, unspecified trimester: Secondary | ICD-10-CM | POA: Diagnosis present

## 2023-12-01 NOTE — Progress Notes (Addendum)
ROB/GBS/NST. 

## 2023-12-01 NOTE — Progress Notes (Signed)
   PRENATAL VISIT NOTE  Subjective:  UBAH RADKE is a 23 y.o. [phone] at [redacted]w[redacted]d being seen today for ongoing prenatal care.  She is currently monitored for the following issues for this low-risk pregnancy and has History of ELISA positive for HSV; Supervision of other normal pregnancy, antepartum; and Alpha thalassemia silent carrier on their problem list.  Patient reports no complaints.  Contractions: Not present. Vag. Bleeding: None.  Movement: Present. Denies leaking of fluid.   The following portions of the patient's history were reviewed and updated as appropriate: allergies, current medications, past family history, past medical history, past social history, past surgical history and problem list.   Objective:    Vitals:   12/01/23 1449  BP: 101/64  Pulse: 99  Weight: 188 lb (85.3 kg)    Fetal Status:  Fetal Heart Rate (bpm): 140   Movement: Present    General: Alert, oriented and cooperative. Patient is in no acute distress.  Skin: Skin is warm and dry. No rash noted.   Cardiovascular: Normal heart rate noted  Respiratory: Normal respiratory effort, no problems with respiration noted  Abdomen: Soft, gravid, appropriate for gestational age.  Pain/Pressure: Absent     Pelvic: Cervical exam performed in the presence of a chaperone Dilation: Closed Effacement (%): 50 Station: -3  Extremities: Normal range of motion.  Edema: None  Mental Status: Normal mood and affect. Normal behavior. Normal judgment and thought content.   Assessment and Plan:  Pregnancy: G4P1021 at [redacted]w[redacted]d 1. Supervision of other normal pregnancy, antepartum (Primary) Patient is doing well Cultures collected Plan for IOL at 41 weeks NST reviewed and baseline 140, mod variability, + accels, no decels  2. [redacted] weeks gestation of pregnancy   Term labor symptoms and general obstetric precautions including but not limited to vaginal bleeding, contractions, leaking of fluid and fetal movement were reviewed in  detail with the patient. Please refer to After Visit Summary for other counseling recommendations.   Return in about 6 weeks (around 01/12/2024) for postpartum.  Future Appointments  Date Time Provider Department Center  12/07/2023  6:30 AM [facility] SCHED ROOM [facility] None    Winton Felt, MD

## 2023-12-02 ENCOUNTER — Telehealth (HOSPITAL_COMMUNITY): Payer: Self-pay | Admitting: *Deleted

## 2023-12-02 LAB — CERVICOVAGINAL ANCILLARY ONLY
Chlamydia: NEGATIVE
Comment: NEGATIVE
Comment: NORMAL
Neisseria Gonorrhea: NEGATIVE

## 2023-12-02 NOTE — Telephone Encounter (Signed)
 Preadmission screen

## 2023-12-03 ENCOUNTER — Telehealth (HOSPITAL_COMMUNITY): Payer: Self-pay | Admitting: *Deleted

## 2023-12-03 ENCOUNTER — Encounter (HOSPITAL_COMMUNITY): Payer: Self-pay | Admitting: *Deleted

## 2023-12-03 NOTE — Telephone Encounter (Signed)
 Preadmission screen

## 2023-12-05 LAB — CULTURE, BETA STREP (GROUP B ONLY): Strep Gp B Culture: NEGATIVE

## 2023-12-06 NOTE — H&P (Signed)
 OBSTETRIC ADMISSION HISTORY AND PHYSICAL  Erika Bailey is a 23 y.o. female [phone] with IUP at [redacted]w[redacted]d by LMP presenting for IOL for post-dates. She reports +FMs, No LOF, no VB, no blurry vision, headaches or peripheral edema, and RUQ pain.  She plans on breast and bottle feeding. She requests pills for birth control. 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    Past Surgical History: Past Surgical History:  Procedure Laterality Date   DILATION AND EVACUATION N/A 01/21/2018   Procedure: DILATATION AND EVACUATION;  Surgeon: Eveline Lynwood MATSU, MD;  Location: [facility];  Service: Gynecology;  Laterality: N/A;   NO PAST SURGERIES      Obstetrical History: OB History     Gravida  4   Para  1   Term  1   Preterm      AB  2   Living  1      SAB  2   IAB  0   Ectopic  0   Multiple  0   Live Births  1           Social History Social History   Socioeconomic History   Marital status: Single    Spouse name: Not on file   Number of children: Not on file   Years of education: Not on file   Highest education level: Not on file  Occupational History   Occupation: [employer]  Tobacco Use   Smoking status: Former    Current packs/day: 0.00    Types: Cigarettes    Quit date: 01/24/2019    Years since quitting: 4.8   Smokeless tobacco: Never  Vaping Use   Vaping status: Never Used  Substance and Sexual Activity   Alcohol use: Not Currently    Alcohol/week: 1.0 standard drink of alcohol    Types: 1 Glasses of wine per week   Drug use: Never   Sexual activity: Yes     Birth control/protection: None  Other Topics Concern   Not on file  Social History Narrative   Not on file   Social Drivers of Health   Financial Resource Strain: Not on File (10/09/2021)   Received from [employer]    Financial Resource Strain: 0  Food Insecurity: No Food Insecurity (12/07/2023)   Hunger Vital Sign    Worried About Running Out of Food in the Last Year: Never true    Ran Out of Food in the Last Year: Never true  Transportation Needs: No Transportation Needs (12/07/2023)   PRAPARE - Administrator, Civil Service (Medical): No    Lack of Transportation (Non-Medical): No  Physical Activity: Not on File (10/09/2021)   Received from [facility]   Physical Activity    Physical Activity: 0  Stress: Not on File (10/09/2021)   [facility]   Stress    Stress: 0  Social Connections: Patient Declined (12/07/2023)   Social Connection and Isolation Panel    Frequency of Communication with Friends and Family: Patient declined    Frequency of Social Gatherings with Friends and Family: Patient declined  Attends Religious Services: Patient declined    Active Member of Clubs or Organizations: Patient declined    Attends Banker Meetings: Patient declined    Marital Status: Patient declined    Family History: Family History  Problem Relation Age of Onset   Healthy Mother    Healthy Father    Cancer Maternal Grandmother     Allergies: Allergies  Allergen Reactions   Peanuts [Peanut Oil] Itching and Swelling    Medications Prior to Admission  Medication Sig Dispense Refill Last Dose/Taking   Blood Pressure Monitoring (BLOOD PRESSURE KIT) DEVI 1 Device by Does not apply route once a week. (Patient not taking: Reported on 09/26/2023) 1 each 0    Prenat-FeAsp-Meth-FA-DHA w/o A (PRENATE PIXIE ) 10-0.6-0.4-200 MG CAPS Take 1 capsule by mouth daily. (Patient not taking: Reported on 09/26/2023) 30 capsule 11      Review of  Systems   All systems reviewed and negative except as stated in HPI  Blood pressure 112/69, pulse 82, temperature 98.3 F (36.8 C), temperature source Oral, resp. rate 14, height 5' 7 (1.702 m), weight 87.2 kg, last menstrual period 02/23/2023, unknown if currently breastfeeding. General appearance: alert, cooperative, and no distress Lungs: clear to auscultation bilaterally Heart: regular rate and rhythm Abdomen: soft, non-tender; bowel sounds normal Extremities: Homans sign is negative, no sign of DVT Presentation: cephalic Fetal monitoringBaseline: 135-145 bpm, Variability: Good {> 6 bpm), Accelerations: Reactive, and Decelerations: Absent Uterine activity: irreg, mild Dilation: 1.5 Effacement (%): 50 Station: -3 Exam by:: KYM Eagles, RNC   Prenatal labs: ABO, Rh: --/--/A POS (09/28 0749) Antibody: NEG (09/28 0749) Rubella: 6.32 (03/12 1101) RPR: Non Reactive (07/03 0946)  HBsAg: Negative (03/12 1101)  HIV: Non Reactive (07/03 0946)  GBS: Negative/-- (09/22 1509)    Lab Results  Component Value Date   GBS Negative 12/01/2023    Immunization History  Administered Date(s) Administered   DTaP, 5 pertussis antigens 05/22/1998, 08/02/1998, 03/19/1999, 04/01/2000   Fluzone Influenza virus vaccine,trivalent (IIV3), split virus 04/16/2012   HIB (PRP-OMP) 05/22/1998, 08/02/1998, 03/19/1999, 06/27/1999   HPV Quadrivalent 08/03/2010, 04/16/2012   Hepatitis A, Ped/Adol-2 Dose 06/22/2009, 08/03/2010   Hepatitis B, PED/ADOLESCENT 05/22/1998, 08/02/1998, 03/19/1999   IPV 05/22/1998, 08/02/1998, 03/19/1999, 07/13/2013   Meningococcal B, Unspecified 09/29/2015, 11/15/2016   Meningococcal Conjugate 06/22/2009, 08/22/2014   PPD Test 10/09/2021   Tdap 06/22/2009, 05/01/2018   Varicella 04/01/2000, 06/22/2009         NURSING  PROVIDER  Office Location [facility] Dating by LMP c/w US  at [redacted]w[redacted]d  [facility] Model Traditional Anatomy U/S Normal   Initiated care at  [facility]   English               LAB RESULTS   Support Person  FOB Genetics NIPS: low risk AFP: neg      Carrier Screen Horizon: Alpha thal silent carrier  Rhogam  A/Positive/-- (03/12 1101)N/A A1C/GTT Early HgbA1C: 5.3% 05/21/2023 Third trimester 2 hr GTT: passed  Flu Vaccine  No      TDaP Vaccine  Declined 09/11/23 Blood Type A/Positive/-- (03/12 1101)A positive 05/21/2023  RSV Vaccine   Antibody Negative (03/12 1101)Negative 05/21/23  COVID Vaccine  No Rubella 6.32 (03/12 1101)6.32 05/21/23  Feeding Plan breast RPR Non Reactive (07/03 0946)Nonreactive 05/21/23  Contraception oral progesterone-only contraceptive HBsAg Negative (03/12 1101)Negative 05/21/23  Circumcision  Yes if female HIV Non Reactive (07/03 0946)Nonreactive 05/21/23  Pediatrician  [facility] HCVAb Non Reactive (03/12 1101)Nonreactive 05/21/23  Prenatal Classes        BTL Consent   Pap       Diagnosis  Date Value Ref Range Status  05/21/2023     Final    - Negative for intraepithelial lesion or malignancy (NILM)    BTL Pre-payment   GC/CT Initial:  neg/neg 05/21/23 36wks:    VBAC Consent   GBS For PCN allergy, check sensitivities   BRx Optimized? [X]  yes   [ ]  no      DME Rx [X]  BP cuff [ ]  Weight Scale Waterbirth  [ ]  Class [ ]  Consent [ ]  CNM visit  PHQ9 & GAD7 [X]  new OB [X]  28 weeks  [  ] 36 weeks Induction  [ ]  Orders Entered [ ] Foley Y/N      Results for orders placed or performed during the hospital encounter of 12/07/23 (from the past 24 hours)  CBC   Collection Time: 12/07/23  7:27 AM  Result Value Ref Range   WBC 7.7 4.0 - 10.5 K/uL   RBC 4.39 3.87 - 5.11 MIL/uL   Hemoglobin 10.1 (L) 12.0 - 15.0 g/dL   HCT 66.8 (L) 63.9 - 53.9 %   MCV 75.4 (L) 80.0 - 100.0 fL   MCH 23.0 (L) 26.0 - 34.0 pg   MCHC 30.5 30.0 - 36.0 g/dL   RDW 82.6 (H) 88.4 - 84.4 %   Platelets 286 150 - 400 K/uL   nRBC 0.0 0.0 - 0.2 %  Type and screen   Collection Time: 12/07/23  7:49 AM  Result Value Ref Range   ABO/RH(D) A POS     Antibody Screen NEG    Sample Expiration      12/10/2023,2359 Performed at [facility], [address], [place], [place] [address]     Patient Active Problem List   Diagnosis Date Noted   Encounter for induction of labor 12/07/2023   Alpha thalassemia silent carrier 06/24/2023   Supervision of other normal pregnancy, antepartum 02/10/2019    Assessment/Plan:  BASSY FETTERLY is a 23 y.o. H5E8978 at [redacted]w[redacted]d here for IOL for post-dates pregnancy  #Labor: cx ripening started with dual cytotec  at 0810; after 4hrs, reassess cervix for either repeat cyto/FB or Pitocin /AROM prn depending on exam #Pain: Considering epidural #FWB: Cat 1 #GBS status:  negative #Feeding: Breastmilk  and Formula #Reproductive Life planning: Progesterone only pills #Circ:  yes  Suzen JONETTA Gentry, CNM  12/07/2023, 9:15 AM

## 2023-12-07 ENCOUNTER — Inpatient Hospital Stay (HOSPITAL_COMMUNITY)
Admission: RE | Admit: 2023-12-07 | Discharge: 2023-12-08 | DRG: 807 | Disposition: A | Discharge status: Home / Self Care | Payer: Self-pay | Attending: Family Medicine | Admitting: Family Medicine

## 2023-12-07 ENCOUNTER — Other Ambulatory Visit: Payer: Self-pay

## 2023-12-07 ENCOUNTER — Inpatient Hospital Stay (HOSPITAL_COMMUNITY): Admitting: Anesthesiology

## 2023-12-07 ENCOUNTER — Encounter (HOSPITAL_COMMUNITY): Payer: Self-pay | Admitting: Obstetrics and Gynecology

## 2023-12-07 ENCOUNTER — Inpatient Hospital Stay (HOSPITAL_COMMUNITY): Payer: Self-pay

## 2023-12-07 DIAGNOSIS — Z3A41 41 weeks gestation of pregnancy: Secondary | ICD-10-CM

## 2023-12-07 DIAGNOSIS — Z87891 Personal history of nicotine dependence: Secondary | ICD-10-CM | POA: Diagnosis not present

## 2023-12-07 DIAGNOSIS — Z148 Genetic carrier of other disease: Secondary | ICD-10-CM

## 2023-12-07 DIAGNOSIS — Z3A40 40 weeks gestation of pregnancy: Secondary | ICD-10-CM

## 2023-12-07 DIAGNOSIS — Z3490 Encounter for supervision of normal pregnancy, unspecified, unspecified trimester: Secondary | ICD-10-CM | POA: Diagnosis present

## 2023-12-07 DIAGNOSIS — D563 Thalassemia minor: Secondary | ICD-10-CM | POA: Diagnosis present

## 2023-12-07 DIAGNOSIS — O480 Post-term pregnancy: Secondary | ICD-10-CM | POA: Diagnosis not present

## 2023-12-07 DIAGNOSIS — Z3480 Encounter for supervision of other normal pregnancy, unspecified trimester: Principal | ICD-10-CM

## 2023-12-07 LAB — TYPE AND SCREEN
ABO/RH(D): A POS
Antibody Screen: NEGATIVE

## 2023-12-07 LAB — CBC
HCT: 33.1 % — ABNORMAL LOW (ref 36.0–46.0)
Hemoglobin: 10.1 g/dL — ABNORMAL LOW (ref 12.0–15.0)
MCH: 23 pg — ABNORMAL LOW (ref 26.0–34.0)
MCHC: 30.5 g/dL (ref 30.0–36.0)
MCV: 75.4 fL — ABNORMAL LOW (ref 80.0–100.0)
Platelets: 286 K/uL (ref 150–400)
RBC: 4.39 MIL/uL (ref 3.87–5.11)
RDW: 17.3 % — ABNORMAL HIGH (ref 11.5–15.5)
WBC: 7.7 K/uL (ref 4.0–10.5)
nRBC: 0 % (ref 0.0–0.2)

## 2023-12-07 LAB — RPR: RPR Ser Ql: NONREACTIVE

## 2023-12-07 MED ORDER — PHENYLEPHRINE 80 MCG/ML (10ML) SYRINGE FOR IV PUSH (FOR BLOOD PRESSURE SUPPORT): 80.0000 ug | PREFILLED_SYRINGE | INTRAVENOUS | Status: DC | PRN

## 2023-12-07 MED ORDER — MISOPROSTOL 50MCG HALF TABLET
50.0000 ug | ORAL_TABLET | Freq: Once | ORAL | Status: AC
  Administered 2023-12-07: 50 ug via ORAL
  Filled 2023-12-07: qty 1

## 2023-12-07 MED ORDER — LIDOCAINE HCL (PF) 1 % IJ SOLN
INTRAMUSCULAR | Status: DC | PRN
  Administered 2023-12-07: 11 mL via EPIDURAL

## 2023-12-07 MED ORDER — LACTATED RINGERS IV SOLN
500.0000 mL | INTRAVENOUS | Status: DC | PRN
  Administered 2023-12-07: 1000 mL via INTRAVENOUS

## 2023-12-07 MED ORDER — OXYTOCIN-SODIUM CHLORIDE 30-0.9 UT/500ML-% IV SOLN: 2.5000 [IU]/h | INTRAVENOUS | Status: DC

## 2023-12-07 MED ORDER — BENZOCAINE-MENTHOL 20-0.5 % EX AERO
1.0000 | INHALATION_SPRAY | CUTANEOUS | Status: DC | PRN
  Administered 2023-12-07: 1 via TOPICAL
  Filled 2023-12-07: qty 56

## 2023-12-07 MED ORDER — SENNOSIDES-DOCUSATE SODIUM 8.6-50 MG PO TABS
2.0000 | ORAL_TABLET | ORAL | Status: DC
  Administered 2023-12-07: 2 via ORAL
  Filled 2023-12-07: qty 2

## 2023-12-07 MED ORDER — WITCH HAZEL-GLYCERIN EX PADS
1.0000 | MEDICATED_PAD | CUTANEOUS | Status: DC | PRN
  Administered 2023-12-07: 1 via TOPICAL

## 2023-12-07 MED ORDER — SOD CITRATE-CITRIC ACID 500-334 MG/5ML PO SOLN: 30.0000 mL | ORAL | Status: DC | PRN

## 2023-12-07 MED ORDER — MISOPROSTOL 25 MCG QUARTER TABLET
25.0000 ug | ORAL_TABLET | Freq: Once | ORAL | Status: AC
  Administered 2023-12-07: 25 ug via VAGINAL
  Filled 2023-12-07: qty 1

## 2023-12-07 MED ORDER — OXYCODONE-ACETAMINOPHEN 5-325 MG PO TABS: 2.0000 | ORAL_TABLET | ORAL | Status: DC | PRN

## 2023-12-07 MED ORDER — IBUPROFEN 600 MG PO TABS
600.0000 mg | ORAL_TABLET | Freq: Four times a day (QID) | ORAL | Status: DC
  Administered 2023-12-07 – 2023-12-08 (×4): 600 mg via ORAL
  Filled 2023-12-07 (×4): qty 1

## 2023-12-07 MED ORDER — SIMETHICONE 80 MG PO CHEW: 80.0000 mg | CHEWABLE_TABLET | ORAL | Status: DC | PRN

## 2023-12-07 MED ORDER — OXYTOCIN-SODIUM CHLORIDE 30-0.9 UT/500ML-% IV SOLN
1.0000 m[IU]/min | INTRAVENOUS | Status: DC
  Administered 2023-12-07: 2 m[IU]/min via INTRAVENOUS

## 2023-12-07 MED ORDER — LACTATED RINGERS IV SOLN: 500.0000 mL | Freq: Once | INTRAVENOUS | Status: DC

## 2023-12-07 MED ORDER — LIDOCAINE HCL (PF) 1 % IJ SOLN: 30.0000 mL | INTRAMUSCULAR | Status: DC | PRN

## 2023-12-07 MED ORDER — TETANUS-DIPHTH-ACELL PERTUSSIS 5-2.5-18.5 LF-MCG/0.5 IM SUSY: 0.5000 mL | PREFILLED_SYRINGE | Freq: Once | INTRAMUSCULAR | Status: DC

## 2023-12-07 MED ORDER — COCONUT OIL OIL: 1.0000 | TOPICAL_OIL | Status: DC | PRN

## 2023-12-07 MED ORDER — PRENATAL MULTIVITAMIN CH
1.0000 | ORAL_TABLET | Freq: Every day | ORAL | Status: DC
  Administered 2023-12-08: 1 via ORAL
  Filled 2023-12-07: qty 1

## 2023-12-07 MED ORDER — LACTATED RINGERS IV SOLN: INTRAVENOUS | Status: DC

## 2023-12-07 MED ORDER — OXYTOCIN BOLUS FROM INFUSION
333.0000 mL | Freq: Once | INTRAVENOUS | Status: AC
  Administered 2023-12-07: 333 mL via INTRAVENOUS

## 2023-12-07 MED ORDER — MEASLES, MUMPS & RUBELLA VAC IJ SOLR: 0.5000 mL | Freq: Once | INTRAMUSCULAR | Status: DC

## 2023-12-07 MED ORDER — ZOLPIDEM TARTRATE 5 MG PO TABS: 5.0000 mg | ORAL_TABLET | Freq: Every evening | ORAL | Status: DC | PRN

## 2023-12-07 MED ORDER — DIPHENHYDRAMINE HCL 25 MG PO CAPS: 25.0000 mg | ORAL_CAPSULE | Freq: Four times a day (QID) | ORAL | Status: DC | PRN

## 2023-12-07 MED ORDER — ONDANSETRON HCL 4 MG/2ML IJ SOLN
4.0000 mg | Freq: Four times a day (QID) | INTRAMUSCULAR | Status: DC | PRN
Start: 2023-12-07 — End: 2023-12-07

## 2023-12-07 MED ORDER — TERBUTALINE SULFATE 1 MG/ML IJ SOLN: 0.2500 mg | Freq: Once | INTRAMUSCULAR | Status: DC | PRN

## 2023-12-07 MED ORDER — OXYTOCIN-SODIUM CHLORIDE 30-0.9 UT/500ML-% IV SOLN
1.0000 m[IU]/min | INTRAVENOUS | Status: DC
  Filled 2023-12-07: qty 500

## 2023-12-07 MED ORDER — FENTANYL-BUPIVACAINE-NACL 0.5-0.125-0.9 MG/250ML-% EP SOLN
12.0000 mL/h | EPIDURAL | Status: DC | PRN
  Administered 2023-12-07: 12 mL/h via EPIDURAL
  Filled 2023-12-07: qty 250

## 2023-12-07 MED ORDER — ONDANSETRON HCL 4 MG/2ML IJ SOLN: 4.0000 mg | INTRAMUSCULAR | Status: DC | PRN

## 2023-12-07 MED ORDER — DIPHENHYDRAMINE HCL 50 MG/ML IJ SOLN: 12.5000 mg | INTRAMUSCULAR | Status: DC | PRN

## 2023-12-07 MED ORDER — ACETAMINOPHEN 325 MG PO TABS: 650.0000 mg | ORAL_TABLET | ORAL | Status: DC | PRN

## 2023-12-07 MED ORDER — EPHEDRINE 5 MG/ML INJ: 10.0000 mg | INTRAVENOUS | Status: DC | PRN

## 2023-12-07 MED ORDER — ONDANSETRON HCL 4 MG PO TABS: 4.0000 mg | ORAL_TABLET | ORAL | Status: DC | PRN

## 2023-12-07 MED ORDER — OXYCODONE-ACETAMINOPHEN 5-325 MG PO TABS: 1.0000 | ORAL_TABLET | ORAL | Status: DC | PRN

## 2023-12-07 MED ORDER — OXYCODONE HCL 5 MG PO TABS: 5.0000 mg | ORAL_TABLET | ORAL | Status: DC | PRN

## 2023-12-07 MED ORDER — MISOPROSTOL 50MCG HALF TABLET: 50.0000 ug | ORAL_TABLET | ORAL | Status: DC | PRN

## 2023-12-07 MED ORDER — DIBUCAINE (PERIANAL) 1 % EX OINT: 1.0000 | TOPICAL_OINTMENT | CUTANEOUS | Status: DC | PRN

## 2023-12-07 NOTE — Discharge Summary (Signed)
 Postpartum Discharge Summary  Date of Service updated***     Patient Name: Erika Bailey DOB: 2000-04-17 MRN: [id number]  Date of admission: 12/07/2023 Delivery date:12/07/2023 Delivering provider: LORELI IHA D Date of discharge: 12/07/2023  Admitting diagnosis: Encounter for induction of labor [Z34.90] Intrauterine pregnancy: [redacted]w[redacted]d     Secondary diagnosis:  Active Problems:   Alpha thalassemia silent carrier   Encounter for induction of labor  Additional problems: none    Discharge diagnosis: Term Pregnancy Delivered                                              Post partum procedures:none Augmentation: AROM, Pitocin , and Cytotec  Complications: None  Hospital course: Induction of Labor With Vaginal Delivery   23 y.o. yo [id number] at [redacted]w[redacted]d was admitted to the hospital 12/07/2023 for induction of labor.  Indication for induction: Postdates.  Patient had an uncomplicated labor course with a rapid active phase/delivery (2-3cm to delivery in 3 hours).  Membrane Rupture Time/Date: 2:38 PM,12/07/2023  Delivery Method:Vaginal, Spontaneous Operative Delivery:N/A Episiotomy: None Lacerations:  None Details of delivery can be found in separate delivery note.  Patient had a postpartum course complicated by***. Patient is discharged home 12/07/23.  Newborn Data: Birth date:12/07/2023 Birth time:3:00 PM Gender:Female Living status:Living Apgars:9 ,9  Weight:3680 g (8lb 1.8oz)  Magnesium Sulfate received: No BMZ received: No Rhophylac:N/A MMR:N/A T-DaP:offered prenatally Flu: No RSV Vaccine received: No Transfusion:No  Immunizations received: Immunization History  Administered Date(s) Administered   DTaP, 5 pertussis antigens 05/22/1998, 08/02/1998, 03/19/1999, 04/01/2000   Fluzone Influenza virus vaccine,trivalent (IIV3), split virus 04/16/2012   HIB (PRP-OMP) 05/22/1998, 08/02/1998, 03/19/1999, 06/27/1999   HPV Quadrivalent 08/03/2010, 04/16/2012   Hepatitis A,  Ped/Adol-2 Dose 06/22/2009, 08/03/2010   Hepatitis B, PED/ADOLESCENT 05/22/1998, 08/02/1998, 03/19/1999   IPV 05/22/1998, 08/02/1998, 03/19/1999, 07/13/2013   Meningococcal B, Unspecified 09/29/2015, 11/15/2016   Meningococcal Conjugate 06/22/2009, 08/22/2014   PPD Test 10/09/2021   Tdap 06/22/2009, 05/01/2018   Varicella 04/01/2000, 06/22/2009    Physical exam  Vitals:   12/07/23 1517 12/07/23 1531 12/07/23 1546 12/07/23 1601  BP: 114/65 115/69 131/83 111/61  Pulse: 90 79 (!) 106 85  Resp:  14    Temp:  98.3 F (36.8 C)    TempSrc:  Oral    Weight:      Height:       General: {Exam; general:21111117} Lochia: {Desc; appropriate/inappropriate:30686::appropriate} Uterine Fundus: {Desc; firm/soft:30687} Incision: {Exam; incision:21111123} DVT Evaluation: {Exam; icu:7888877} Labs: Lab Results  Component Value Date   WBC 7.7 12/07/2023   HGB 10.1 (L) 12/07/2023   HCT 33.1 (L) 12/07/2023   MCV 75.4 (L) 12/07/2023   PLT 286 12/07/2023      Latest Ref Rng & Units 03/29/2023    1:34 PM  CMP  Glucose 70 - 99 mg/dL 90   BUN 6 - 20 mg/dL 15   Creatinine 9.55 - 1.00 mg/dL 9.24   Sodium 864 - 854 mmol/L 137   Potassium 3.5 - 5.1 mmol/L 4.2   Chloride 98 - 111 mmol/L 103   CO2 22 - 32 mmol/L 25   Calcium 8.9 - 10.3 mg/dL 9.8   Total Protein 6.5 - 8.1 g/dL 7.4   Total Bilirubin 0.0 - 1.2 mg/dL 0.4   Alkaline Phos 38 - 126 U/L 63   AST 15 - 41 U/L 15   ALT 0 -  44 U/L 7    Edinburgh Score:    10/07/2019   12:00 PM  Edinburgh Postnatal Depression Scale Screening Tool  I have been able to laugh and see the funny side of things. 0  I have looked forward with enjoyment to things. 0  I have blamed myself unnecessarily when things went wrong. 1  I have been anxious or worried for no good reason. 2  I have felt scared or panicky for no good reason. 1  Things have been getting on top of me. 1  I have been so unhappy that I have had difficulty sleeping. 1  I have felt sad or  miserable. 1  I have been so unhappy that I have been crying. 1  The thought of harming myself has occurred to me. 0  Edinburgh Postnatal Depression Scale Total 8      Data saved with a previous flowsheet row definition   No data recorded  After visit meds:  Allergies as of 12/07/2023       Reactions   Peanuts [peanut Oil] Itching, Swelling     Med Rec must be completed prior to using this [facility]***        Discharge home in stable condition Infant Feeding: {Baby feeding:23562} Infant Disposition:{CHL IP OB HOME WITH FNUYZM:76418} Discharge instruction: per After Visit Summary and Postpartum booklet. Activity: Advance as tolerated. Pelvic rest for 6 weeks.  Diet: routine diet Future Appointments: Future Appointments  Date Time Provider Department Center  01/14/2024  9:15 AM Constant, Winton, MD [facility] None   (Has PP visit)   12/07/2023 Suzen JONETTA Gentry, CNM

## 2023-12-07 NOTE — Progress Notes (Signed)
 Patient ID: Erika Bailey, female   DOB: 2000-12-16, 22 y.o.   MRN: [id number]  S/p dual cytotec  dosing; feeling some cramping but no pain  BP 110/64, P 81 FHR 140s, +accels, no decels, occ mi variables Ctx irreg 2-6 mins Cx 2-3cm/thick/soft, vtx -2  IUP@41 .0wks Cx favorable IOL process  Begin Pitocin  2 x 2 and uptitrate to get ctx regular Anticipate vag delivery  Suzen JONETTA Gentry CNM 12/07/2023 12:14 PM

## 2023-12-07 NOTE — Lactation Note (Addendum)
 This note was copied from a baby's chart. Lactation Consultation Note  Patient Name: Erika Bailey Date: 12/07/2023 Age:25 hours Reason for consult: Initial assessment;Term  P2- MOB reports that infant is latching well so far. MOB denies having any questions or concerns at this time. Infant had just fed, so LC encouraged MOB to call out for infant's next feeding for a latch assessment. LC reviewed the first 24 hr birthday nap, day 2 cluster feeding, feeding infant on cue 8-12x in 24 hrs, not allowing infant to go over 3 hrs without a feeding, CDC milk storage guidelines, LC services handout and engorgement/breast care. LC encouraged MOB to call for further assistance as needed.  Maternal Data Has patient been taught Hand Expression?: No Does the patient have breastfeeding experience prior to this delivery?: Yes How long did the patient breastfeed?: 3 months, stopped when she went back to work due to pumping issues  Feeding Mother's Current Feeding Choice: Breast Milk  LATCH Score Latch: Grasps breast easily, tongue down, lips flanged, rhythmical sucking.  Audible Swallowing: A few with stimulation  Type of Nipple: Everted at rest and after stimulation  Comfort (Breast/Nipple): Soft / non-tender  Hold (Positioning): No assistance needed to correctly position infant at breast.  LATCH Score: 9   Lactation Tools Discussed/Used Pump Education: Milk Storage  Interventions Interventions: Breast feeding basics reviewed;Education;LC Services brochure  Discharge Discharge Education: Engorgement and breast care;Warning signs for feeding baby Pump: DEBP;Personal  Consult Status Consult Status: Follow-up Date: 12/08/23 Follow-up type: In-patient    Recardo Hoit BS, IBCLC 12/07/2023, 6:52 PM

## 2023-12-07 NOTE — Anesthesia Preprocedure Evaluation (Addendum)
 Anesthesia Evaluation  Patient identified by MRN, date of birth, ID band Patient awake    Reviewed: Allergy & Precautions, Patient's Chart, lab work & pertinent test results  Airway Mallampati: II  TM Distance: >3 FB Neck ROM: Full    Dental no notable dental hx.    Pulmonary former smoker   Pulmonary exam normal breath sounds clear to auscultation       Cardiovascular negative cardio ROS Normal cardiovascular exam Rhythm:Regular Rate:Normal     Neuro/Psych negative neurological ROS     GI/Hepatic negative GI ROS, Neg liver ROS,,,  Endo/Other  negative endocrine ROS    Renal/GU negative Renal ROS     Musculoskeletal negative musculoskeletal ROS (+)    Abdominal   Peds  Hematology negative hematology ROS (+)   Anesthesia Other Findings   Reproductive/Obstetrics (+) Pregnancy                              Anesthesia Physical Anesthesia Plan  ASA: II  Anesthesia Plan: Epidural   Post-op Pain Management:    Induction:   PONV Risk Score and Plan: 2  Airway Management Planned: Natural Airway  Additional Equipment: None  Intra-op Plan:   Post-operative Plan:   Informed Consent: I have reviewed the patients History and Physical, chart, labs and discussed the procedure including the risks, benefits and alternatives for the proposed anesthesia with the patient or authorized representative who has indicated his/her understanding and acceptance.       Plan Discussed with:   Anesthesia Plan Comments:          Anesthesia Quick Evaluation

## 2023-12-07 NOTE — Anesthesia Procedure Notes (Signed)
 Epidural Patient location during procedure: OB Start time: 12/07/2023 2:07 PM End time: 12/07/2023 2:30 PM  Staffing Anesthesiologist: Cleotilde Butler Dade, MD Performed: anesthesiologist   Preanesthetic Checklist Completed: patient identified, IV checked, site marked, risks and benefits discussed, surgical consent, monitors and equipment checked, pre-op evaluation and timeout performed  Epidural Patient position: sitting Prep: ChloraPrep Patient monitoring: heart rate, cardiac monitor, continuous pulse ox and blood pressure Approach: midline Location: L2-L3 Injection technique: LOR saline  Needle:  Needle type: Tuohy  Needle gauge: 17 G Needle length: 9 cm Needle insertion depth: 6 cm Catheter type: closed end flexible Catheter size: 20 Guage Catheter at skin depth: 10 cm Test dose: negative  Assessment Events: blood not aspirated, injection not painful, no injection resistance, no paresthesia and negative IV test  Additional Notes Reason for block:procedure for pain

## 2023-12-08 ENCOUNTER — Other Ambulatory Visit (HOSPITAL_COMMUNITY): Payer: Self-pay

## 2023-12-08 MED ORDER — DIBUCAINE (PERIANAL) 1 % EX OINT: 1.0000 | TOPICAL_OINTMENT | CUTANEOUS | Status: AC | PRN

## 2023-12-08 MED ORDER — ACETAMINOPHEN 325 MG PO TABS
650.0000 mg | ORAL_TABLET | ORAL | 0 refills | Status: AC | PRN
  Filled 2023-12-08: qty 60, 5d supply, fill #0

## 2023-12-08 MED ORDER — IBUPROFEN 600 MG PO TABS
600.0000 mg | ORAL_TABLET | Freq: Four times a day (QID) | ORAL | 0 refills | Status: AC
  Filled 2023-12-08: qty 30, 8d supply, fill #0

## 2023-12-08 MED ORDER — COCONUT OIL OIL: 1.0000 | TOPICAL_OIL | Status: AC | PRN

## 2023-12-08 MED ORDER — SENNOSIDES-DOCUSATE SODIUM 8.6-50 MG PO TABS
2.0000 | ORAL_TABLET | ORAL | 0 refills | Status: DC
  Filled 2023-12-08: qty 60, 30d supply, fill #0

## 2023-12-08 MED ORDER — WITCH HAZEL-GLYCERIN EX PADS
1.0000 | MEDICATED_PAD | CUTANEOUS | 12 refills | Status: AC | PRN
  Filled 2023-12-08: qty 40, 14d supply, fill #0

## 2023-12-08 NOTE — Lactation Note (Signed)
 This note was copied from a baby's chart. Lactation Consultation Note  Patient Name: Erika Bailey Unijb'd Date: 12/08/2023 Age:25 hours Reason for consult: Follow-up assessment;Term  P2, Baby skin to skin on mother's chest when LC entered room sleeping. Placed baby in crib to wake and checked suck.  Strong suck noted.  Reviewed waking techniques. Baby has not breastfed since 0730.   Mother hand expressed drops prior to latching. Gave baby back to baby mother. Baby latched in cross cradle hold with good depth.  Intermittent swallows observed. Placed pillows under mother's arms for support. Reviewed engorgement care and monitoring voids/stools. Feed on demand with cues.  Goal 8-12+ times per day after first 24 hrs.  Wake baby, changes his diaper if he does not feed. Maternal Data Has patient been taught Hand Expression?: Yes Does the patient have breastfeeding experience prior to this delivery?: Yes  Feeding Mother's Current Feeding Choice: Breast Milk  LATCH Score Latch: Grasps breast easily, tongue down, lips flanged, rhythmical sucking.  Audible Swallowing: A few with stimulation  Type of Nipple: Everted at rest and after stimulation  Comfort (Breast/Nipple): Soft / non-tender  Hold (Positioning): Assistance needed to correctly position infant at breast and maintain latch.  LATCH Score: 8   Lactation Tools Discussed/Used    Interventions Interventions: Breast feeding basics reviewed;Assisted with latch;Skin to skin;Hand express;Education  Discharge Discharge Education: Engorgement and breast care;Warning signs for feeding baby Pump: DEBP;Personal  Consult Status Consult Status: Complete Date: 12/08/23    Shannon Dines [facility] 12/08/2023, 1:05 PM

## 2023-12-08 NOTE — Anesthesia Postprocedure Evaluation (Signed)
 Anesthesia Post Note  Patient: Erika Bailey  Procedure(s) Performed: AN AD HOC LABOR EPIDURAL     Patient location during evaluation: Mother Baby Anesthesia Type: Epidural Level of consciousness: awake and alert Pain management: pain level controlled Vital Signs Assessment: post-procedure vital signs reviewed and stable Respiratory status: spontaneous breathing, nonlabored ventilation and respiratory function stable Cardiovascular status: stable Postop Assessment: no headache, no backache and epidural receding Anesthetic complications: no   No notable events documented.  Last Vitals:  Vitals:   12/08/23 0147 12/08/23 0537  BP: 110/76 106/69  Pulse: 72 75  Resp: 18 18  Temp: 36.8 C 36.7 C  SpO2: 100% 99%    Last Pain:  Vitals:   12/08/23 0722  TempSrc:   PainSc: 2    Pain Goal:                   [name]

## 2023-12-08 NOTE — Progress Notes (Signed)
 POSTPARTUM PROGRESS NOTE  Post Partum Day 1  Subjective:  Erika Bailey is a 23 y.o. H5E7977 s/p SVD at [redacted]w[redacted]d.  She reports she is doing well. No acute events overnight. She denies any problems with ambulating, voiding or po intake. Denies nausea or vomiting.  Pain is well controlled.  Lochia is Normal.  Objective: Blood pressure 106/69, pulse 75, temperature 98 F (36.7 C), temperature source Oral, resp. rate 18, height 5' 7 (1.702 m), weight 87.2 kg, last menstrual period 02/23/2023, SpO2 99%, unknown if currently breastfeeding.  BP Readings from Last 3 Encounters:  12/08/23 106/69  12/01/23 101/64  10/18/23 105/63    Physical Exam:  General: alert, cooperative and no distress Chest: no respiratory distress Heart:regular rate, distal pulses intact Uterine Fundus: firm, appropriately tender DVT Evaluation: No calf swelling or tenderness Extremities: none edema Skin: warm, dry  Recent Labs    12/07/23 0727  HGB 10.1*  HCT 33.1*    Assessment/Plan: Erika Bailey is a 23 y.o. H5E7977 s/p NSVD at [redacted]w[redacted]d   PPD# 1 - Doing well  Routine postpartum care  Delivery Complications: none  Blood Pressure: normal Hb Status: Stable, appropriate postpartum Hb, no intervention indicated Contraception: Progesterone-only Oral Contraceptive Pills Feeding: breast feeding Needs Lactation Consultation: Yes   Dispo: Plan for discharge today or tomorrow, patient is undecided.   LOS: 1 day   Charlie DELENA Courts, MD OB Fellow  12/08/2023, 11:42 AM

## 2023-12-16 ENCOUNTER — Telehealth (HOSPITAL_COMMUNITY): Payer: Self-pay | Admitting: *Deleted

## 2023-12-16 NOTE — Telephone Encounter (Signed)
 Attempted hospital discharge follow-up call. Left message for patient to return RN call with any questions or concerns. Allean IVAR Carton, RN, 12/16/23, 1800

## 2024-01-14 ENCOUNTER — Encounter: Payer: Self-pay | Admitting: Obstetrics and Gynecology

## 2024-01-14 ENCOUNTER — Ambulatory Visit (INDEPENDENT_AMBULATORY_CARE_PROVIDER_SITE_OTHER): Payer: Self-pay | Admitting: Obstetrics and Gynecology

## 2024-01-14 NOTE — Progress Notes (Signed)
 Post Partum Visit Note  Erika Bailey is a 23 y.o. [phone] female who presents for a postpartum visit. She is 5 weeks postpartum following a normal spontaneous vaginal delivery.  I have fully reviewed the prenatal and intrapartum course. The delivery was at 41 gestational weeks.  Anesthesia: epidural. Postpartum course has been uncomplicated. Baby is doing well. Baby is feeding by breast. Bleeding thin lochia. Bowel function is normal. Bladder function is normal. Patient is not sexually active. Contraception method is abstinence.   Postpartum depression screening: negative, score 0.   The pregnancy intention screening data noted above was reviewed. Potential methods of contraception were discussed. The patient elected to proceed with No data recorded.   Edinburgh Postnatal Depression Scale - 01/14/24 0929       Edinburgh Postnatal Depression Scale:  In the Past 7 Days   I have been able to laugh and see the funny side of things. 0    I have looked forward with enjoyment to things. 0    I have blamed myself unnecessarily when things went wrong. 0    I have been anxious or worried for no good reason. 0    I have felt scared or panicky for no good reason. 0    Things have been getting on top of me. 0    I have been so unhappy that I have had difficulty sleeping. 0    I have felt sad or miserable. 0    I have been so unhappy that I have been crying. 0    The thought of harming myself has occurred to me. 0    Edinburgh Postnatal Depression Scale Total 0          Health Maintenance Due  Topic Date Due   Influenza Vaccine  10/10/2023   COVID-19 Vaccine (1 - 2025-26 season) Never done       Review of Systems Pertinent items noted in HPI and remainder of comprehensive ROS otherwise negative.  Objective:  BP 112/75   Pulse (!) 56   LMP 02/23/2023 (Exact Date)   Breastfeeding Yes    General:  alert, cooperative, and no distress   Breasts:  normal  Lungs: clear to  auscultation bilaterally  Heart:  regular rate and rhythm  Abdomen: soft, non-tender; bowel sounds normal; no masses,  no organomegaly   Wound N/A  GU exam:  not indicated       Assessment:    There are no diagnoses linked to this encounter.  Normal postpartum exam.   Plan:   Essential components of care per ACOG recommendations:  1.  Mood and well being: Patient with negative depression screening today. Reviewed local resources for support.  - Patient tobacco use? No.   - hx of drug use? No.    2. Infant care and feeding:  -Patient currently breastmilk feeding? Yes. Discussed returning to work and pumping.  -Social determinants of health (SDOH) reviewed in EPIC. No concerns  3. Sexuality, contraception and birth spacing - Patient does not want a pregnancy in the next year.  Desired family size is 2 children.  - Reviewed reproductive life planning. Reviewed contraceptive methods based on pt preferences and effectiveness.  Patient desired Abstinence today.   - Discussed birth spacing of 18 months  4. Sleep and fatigue -Encouraged family/partner/community support of 4 hrs of uninterrupted sleep to help with mood and fatigue  5. Physical Recovery  - Discussed patients delivery and complications. She describes her labor as good. -  Patient had a Vaginal, no problems at delivery. Patient had no laceration. Perineal healing reviewed. Patient expressed understanding - Patient has urinary incontinence? No. - Patient is safe to resume physical and sexual activity  6.  Health Maintenance - HM due items addressed Yes - Last pap smear  Diagnosis  Date Value Ref Range Status  05/21/2023   Final   - Negative for intraepithelial lesion or malignancy (NILM)   Pap smear not done at today's visit.  -Breast Cancer screening indicated? No.   7. Chronic Disease/Pregnancy Condition follow up: None  - PCP follow up  Winton Felt, MD [facility], [facility]

## 2024-02-17 ENCOUNTER — Other Ambulatory Visit: Payer: Self-pay
